# Patient Record
Sex: Female | Born: 1975 | Race: White | Hispanic: No | Smoking: Former smoker
Health system: Southern US, Community
[De-identification: ages and names within clinical notes are randomized; demographics above are authoritative.]

## PROBLEM LIST (undated history)

## (undated) DIAGNOSIS — R609 Edema, unspecified: Secondary | ICD-10-CM

## (undated) DIAGNOSIS — J189 Pneumonia, unspecified organism: Secondary | ICD-10-CM

## (undated) DIAGNOSIS — G4733 Obstructive sleep apnea (adult) (pediatric): Secondary | ICD-10-CM

## (undated) DIAGNOSIS — Z818 Family history of other mental and behavioral disorders: Secondary | ICD-10-CM

## (undated) DIAGNOSIS — J449 Chronic obstructive pulmonary disease, unspecified: Secondary | ICD-10-CM

## (undated) DIAGNOSIS — J45909 Unspecified asthma, uncomplicated: Secondary | ICD-10-CM

## (undated) DIAGNOSIS — Z8249 Family history of ischemic heart disease and other diseases of the circulatory system: Secondary | ICD-10-CM

## (undated) DIAGNOSIS — K219 Gastro-esophageal reflux disease without esophagitis: Secondary | ICD-10-CM

## (undated) DIAGNOSIS — IMO0002 Reserved for concepts with insufficient information to code with codable children: Secondary | ICD-10-CM

## (undated) DIAGNOSIS — Q268 Other congenital malformations of great veins: Secondary | ICD-10-CM

## (undated) DIAGNOSIS — G4730 Sleep apnea, unspecified: Secondary | ICD-10-CM

## (undated) DIAGNOSIS — E669 Obesity, unspecified: Secondary | ICD-10-CM

## (undated) DIAGNOSIS — T7840XA Allergy, unspecified, initial encounter: Secondary | ICD-10-CM

## (undated) DIAGNOSIS — F17200 Nicotine dependence, unspecified, uncomplicated: Secondary | ICD-10-CM

## (undated) HISTORY — DX: Other congenital malformations of great veins: Q26.8

## (undated) HISTORY — DX: Edema, unspecified: R60.9

## (undated) HISTORY — DX: Chronic obstructive pulmonary disease, unspecified: J44.9

## (undated) HISTORY — DX: Gastro-esophageal reflux disease without esophagitis: K21.9

## (undated) HISTORY — DX: Pneumonia, unspecified organism: J18.9

## (undated) HISTORY — PX: EYE SURGERY: SHX253

## (undated) HISTORY — DX: Family history of ischemic heart disease and other diseases of the circulatory system: Z82.49

## (undated) HISTORY — PX: ABDOMINAL HYSTERECTOMY: SHX81

## (undated) HISTORY — DX: Obstructive sleep apnea (adult) (pediatric): G47.33

## (undated) HISTORY — DX: Family history of other mental and behavioral disorders: Z81.8

## (undated) HISTORY — DX: Nicotine dependence, unspecified, uncomplicated: F17.200

## (undated) HISTORY — DX: Sleep apnea, unspecified: G47.30

## (undated) HISTORY — DX: Obesity, unspecified: E66.9

## (undated) HISTORY — DX: Allergy, unspecified, initial encounter: T78.40XA

---

## 1998-08-25 ENCOUNTER — Emergency Department (HOSPITAL_COMMUNITY): Admission: EM | Admit: 1998-08-25 | Discharge: 1998-08-25 | Payer: Self-pay | Admitting: Emergency Medicine

## 2001-04-29 ENCOUNTER — Emergency Department (HOSPITAL_COMMUNITY): Admission: EM | Admit: 2001-04-29 | Discharge: 2001-04-29 | Payer: Self-pay | Admitting: Emergency Medicine

## 2001-05-01 ENCOUNTER — Inpatient Hospital Stay (HOSPITAL_COMMUNITY): Admission: EM | Admit: 2001-05-01 | Discharge: 2001-05-04 | Payer: Self-pay

## 2001-05-03 ENCOUNTER — Encounter: Payer: Self-pay | Admitting: Internal Medicine

## 2001-05-07 ENCOUNTER — Encounter: Admission: RE | Admit: 2001-05-07 | Discharge: 2001-05-07 | Payer: Self-pay

## 2002-07-14 ENCOUNTER — Emergency Department (HOSPITAL_COMMUNITY): Admission: EM | Admit: 2002-07-14 | Discharge: 2002-07-15 | Payer: Self-pay | Admitting: Emergency Medicine

## 2002-09-18 ENCOUNTER — Emergency Department (HOSPITAL_COMMUNITY): Admission: EM | Admit: 2002-09-18 | Discharge: 2002-09-18 | Payer: Self-pay | Admitting: Emergency Medicine

## 2002-09-18 ENCOUNTER — Encounter: Payer: Self-pay | Admitting: Emergency Medicine

## 2005-06-18 ENCOUNTER — Ambulatory Visit: Payer: Self-pay | Admitting: Internal Medicine

## 2005-06-19 ENCOUNTER — Ambulatory Visit (HOSPITAL_COMMUNITY): Admission: RE | Admit: 2005-06-19 | Discharge: 2005-06-19 | Payer: Self-pay | Admitting: Internal Medicine

## 2005-06-20 ENCOUNTER — Ambulatory Visit: Payer: Self-pay | Admitting: *Deleted

## 2005-06-27 ENCOUNTER — Ambulatory Visit: Payer: Self-pay | Admitting: Internal Medicine

## 2005-07-01 ENCOUNTER — Ambulatory Visit: Payer: Self-pay | Admitting: Internal Medicine

## 2005-07-04 ENCOUNTER — Ambulatory Visit: Payer: Self-pay | Admitting: Internal Medicine

## 2005-07-17 ENCOUNTER — Ambulatory Visit: Payer: Self-pay

## 2005-07-17 ENCOUNTER — Encounter: Payer: Self-pay | Admitting: Cardiology

## 2006-01-10 ENCOUNTER — Emergency Department (HOSPITAL_COMMUNITY): Admission: EM | Admit: 2006-01-10 | Discharge: 2006-01-10 | Payer: Self-pay | Admitting: Family Medicine

## 2006-07-07 ENCOUNTER — Other Ambulatory Visit: Admission: RE | Admit: 2006-07-07 | Discharge: 2006-07-07 | Payer: Self-pay | Admitting: Obstetrics and Gynecology

## 2006-07-17 ENCOUNTER — Ambulatory Visit (HOSPITAL_COMMUNITY): Admission: RE | Admit: 2006-07-17 | Discharge: 2006-07-17 | Payer: Self-pay | Admitting: Obstetrics and Gynecology

## 2006-08-14 ENCOUNTER — Encounter: Admission: RE | Admit: 2006-08-14 | Discharge: 2006-08-14 | Payer: Self-pay | Admitting: Obstetrics and Gynecology

## 2006-11-12 ENCOUNTER — Emergency Department (HOSPITAL_COMMUNITY): Admission: EM | Admit: 2006-11-12 | Discharge: 2006-11-12 | Payer: Self-pay | Admitting: Emergency Medicine

## 2006-11-26 LAB — CONVERTED CEMR LAB: Pap Smear: NORMAL

## 2007-03-05 ENCOUNTER — Ambulatory Visit: Payer: Self-pay | Admitting: Pulmonary Disease

## 2007-03-09 ENCOUNTER — Emergency Department (HOSPITAL_COMMUNITY): Admission: EM | Admit: 2007-03-09 | Discharge: 2007-03-10 | Payer: Self-pay | Admitting: *Deleted

## 2007-03-31 DIAGNOSIS — J449 Chronic obstructive pulmonary disease, unspecified: Secondary | ICD-10-CM | POA: Insufficient documentation

## 2007-03-31 DIAGNOSIS — F17200 Nicotine dependence, unspecified, uncomplicated: Secondary | ICD-10-CM | POA: Insufficient documentation

## 2007-03-31 DIAGNOSIS — E669 Obesity, unspecified: Secondary | ICD-10-CM | POA: Insufficient documentation

## 2007-03-31 DIAGNOSIS — J4489 Other specified chronic obstructive pulmonary disease: Secondary | ICD-10-CM | POA: Insufficient documentation

## 2007-03-31 DIAGNOSIS — Q268 Other congenital malformations of great veins: Secondary | ICD-10-CM | POA: Insufficient documentation

## 2007-04-23 ENCOUNTER — Ambulatory Visit: Payer: Self-pay | Admitting: Internal Medicine

## 2007-04-30 ENCOUNTER — Telehealth: Payer: Self-pay | Admitting: Internal Medicine

## 2007-05-18 ENCOUNTER — Telehealth (INDEPENDENT_AMBULATORY_CARE_PROVIDER_SITE_OTHER): Payer: Self-pay | Admitting: *Deleted

## 2007-05-20 ENCOUNTER — Ambulatory Visit: Payer: Self-pay | Admitting: Internal Medicine

## 2007-05-27 ENCOUNTER — Telehealth: Payer: Self-pay | Admitting: Internal Medicine

## 2007-07-01 ENCOUNTER — Encounter: Payer: Self-pay | Admitting: Internal Medicine

## 2007-07-03 ENCOUNTER — Ambulatory Visit (HOSPITAL_COMMUNITY): Admission: RE | Admit: 2007-07-03 | Discharge: 2007-07-03 | Payer: Self-pay | Admitting: Surgery

## 2007-07-03 ENCOUNTER — Telehealth (INDEPENDENT_AMBULATORY_CARE_PROVIDER_SITE_OTHER): Payer: Self-pay | Admitting: *Deleted

## 2007-07-09 ENCOUNTER — Ambulatory Visit (HOSPITAL_COMMUNITY): Admission: RE | Admit: 2007-07-09 | Discharge: 2007-07-09 | Payer: Self-pay | Admitting: Surgery

## 2007-07-10 ENCOUNTER — Ambulatory Visit: Payer: Self-pay | Admitting: Internal Medicine

## 2007-07-10 ENCOUNTER — Telehealth: Payer: Self-pay | Admitting: Internal Medicine

## 2007-07-12 DIAGNOSIS — K219 Gastro-esophageal reflux disease without esophagitis: Secondary | ICD-10-CM | POA: Insufficient documentation

## 2007-07-14 ENCOUNTER — Telehealth (INDEPENDENT_AMBULATORY_CARE_PROVIDER_SITE_OTHER): Payer: Self-pay | Admitting: *Deleted

## 2007-07-28 ENCOUNTER — Encounter: Admission: RE | Admit: 2007-07-28 | Discharge: 2007-07-28 | Payer: Self-pay | Admitting: Surgery

## 2007-09-02 ENCOUNTER — Telehealth: Payer: Self-pay | Admitting: Internal Medicine

## 2007-09-07 ENCOUNTER — Telehealth (INDEPENDENT_AMBULATORY_CARE_PROVIDER_SITE_OTHER): Payer: Self-pay | Admitting: *Deleted

## 2007-09-07 ENCOUNTER — Emergency Department (HOSPITAL_COMMUNITY): Admission: EM | Admit: 2007-09-07 | Discharge: 2007-09-07 | Payer: Self-pay | Admitting: Emergency Medicine

## 2007-09-08 ENCOUNTER — Ambulatory Visit: Payer: Self-pay | Admitting: Internal Medicine

## 2007-09-08 DIAGNOSIS — R609 Edema, unspecified: Secondary | ICD-10-CM | POA: Insufficient documentation

## 2007-09-09 ENCOUNTER — Encounter: Payer: Self-pay | Admitting: Internal Medicine

## 2007-09-09 ENCOUNTER — Ambulatory Visit (HOSPITAL_BASED_OUTPATIENT_CLINIC_OR_DEPARTMENT_OTHER): Admission: RE | Admit: 2007-09-09 | Discharge: 2007-09-09 | Payer: Self-pay | Admitting: *Deleted

## 2007-09-12 ENCOUNTER — Ambulatory Visit: Payer: Self-pay | Admitting: Internal Medicine

## 2007-09-23 ENCOUNTER — Ambulatory Visit: Payer: Self-pay | Admitting: Internal Medicine

## 2007-09-23 DIAGNOSIS — J309 Allergic rhinitis, unspecified: Secondary | ICD-10-CM | POA: Insufficient documentation

## 2007-09-23 LAB — CONVERTED CEMR LAB
BUN: 15 mg/dL (ref 6–23)
Basophils Absolute: 0.1 10*3/uL (ref 0.0–0.1)
CO2: 32 meq/L (ref 19–32)
Calcium: 9.1 mg/dL (ref 8.4–10.5)
Chloride: 108 meq/L (ref 96–112)
Eosinophils Absolute: 0.1 10*3/uL (ref 0.0–0.7)
GFR calc Af Amer: 83 mL/min
HCT: 39.2 % (ref 36.0–46.0)
Hemoglobin: 12.9 g/dL (ref 12.0–15.0)
Lymphocytes Relative: 22 % (ref 12.0–46.0)
MCHC: 32.8 g/dL (ref 30.0–36.0)
Monocytes Relative: 5.1 % (ref 3.0–12.0)
Neutro Abs: 7.9 10*3/uL — ABNORMAL HIGH (ref 1.4–7.7)
Platelets: 226 10*3/uL (ref 150–400)
Potassium: 3.8 meq/L (ref 3.5–5.1)

## 2007-10-12 HISTORY — PX: GASTRIC BYPASS: SHX52

## 2007-10-15 ENCOUNTER — Ambulatory Visit: Payer: Self-pay | Admitting: Internal Medicine

## 2007-10-23 ENCOUNTER — Telehealth (INDEPENDENT_AMBULATORY_CARE_PROVIDER_SITE_OTHER): Payer: Self-pay | Admitting: *Deleted

## 2007-10-26 ENCOUNTER — Ambulatory Visit: Payer: Self-pay | Admitting: Internal Medicine

## 2007-11-18 ENCOUNTER — Telehealth: Payer: Self-pay | Admitting: Internal Medicine

## 2007-11-24 ENCOUNTER — Encounter: Admission: RE | Admit: 2007-11-24 | Discharge: 2008-02-02 | Payer: Self-pay | Admitting: Surgery

## 2007-11-25 ENCOUNTER — Telehealth: Payer: Self-pay | Admitting: Internal Medicine

## 2007-11-25 ENCOUNTER — Encounter: Payer: Self-pay | Admitting: Internal Medicine

## 2007-12-08 ENCOUNTER — Inpatient Hospital Stay (HOSPITAL_COMMUNITY): Admission: RE | Admit: 2007-12-08 | Discharge: 2007-12-10 | Payer: Self-pay | Admitting: Surgery

## 2007-12-09 ENCOUNTER — Encounter (INDEPENDENT_AMBULATORY_CARE_PROVIDER_SITE_OTHER): Payer: Self-pay | Admitting: Surgery

## 2007-12-09 ENCOUNTER — Ambulatory Visit: Payer: Self-pay | Admitting: Vascular Surgery

## 2008-01-14 ENCOUNTER — Encounter: Payer: Self-pay | Admitting: Internal Medicine

## 2008-01-14 ENCOUNTER — Telehealth (INDEPENDENT_AMBULATORY_CARE_PROVIDER_SITE_OTHER): Payer: Self-pay | Admitting: *Deleted

## 2008-01-28 ENCOUNTER — Encounter: Payer: Self-pay | Admitting: Internal Medicine

## 2008-02-09 ENCOUNTER — Encounter: Admission: RE | Admit: 2008-02-09 | Discharge: 2008-03-22 | Payer: Self-pay | Admitting: Surgery

## 2008-02-12 ENCOUNTER — Encounter
Admission: RE | Admit: 2008-02-12 | Discharge: 2008-02-12 | Payer: Self-pay | Admitting: Physical Medicine and Rehabilitation

## 2008-02-15 ENCOUNTER — Ambulatory Visit: Payer: Self-pay | Admitting: Internal Medicine

## 2008-05-12 ENCOUNTER — Encounter (INDEPENDENT_AMBULATORY_CARE_PROVIDER_SITE_OTHER): Payer: Self-pay | Admitting: *Deleted

## 2008-05-19 ENCOUNTER — Ambulatory Visit: Payer: Self-pay | Admitting: Family Medicine

## 2008-05-19 DIAGNOSIS — H409 Unspecified glaucoma: Secondary | ICD-10-CM | POA: Insufficient documentation

## 2008-05-20 ENCOUNTER — Ambulatory Visit: Payer: Self-pay | Admitting: Family Medicine

## 2008-05-20 LAB — CONVERTED CEMR LAB
Bilirubin Urine: NEGATIVE
Blood in Urine, dipstick: NEGATIVE
Ketones, urine, test strip: NEGATIVE
Specific Gravity, Urine: <1.005
pH: 6.5

## 2008-06-02 LAB — CONVERTED CEMR LAB
AST: 25 units/L (ref 0–37)
Albumin: 3.6 g/dL (ref 3.5–5.2)
Alkaline Phosphatase: 55 units/L (ref 39–117)
Basophils Absolute: 0 10*3/uL (ref 0.0–0.1)
Basophils Relative: 0.2 % (ref 0.0–3.0)
Bilirubin, Direct: 0.2 mg/dL (ref 0.0–0.3)
Calcium: 9.6 mg/dL (ref 8.4–10.5)
Chloride: 106 meq/L (ref 96–112)
Creatinine, Ser: 0.8 mg/dL (ref 0.4–1.2)
GFR calc Af Amer: 106 mL/min
GFR calc non Af Amer: 88 mL/min
Glucose, Bld: 90 mg/dL (ref 70–99)
HDL: 34.2 mg/dL — ABNORMAL LOW (ref >39.0)
Lymphocytes Relative: 34.5 % (ref 12.0–46.0)
MCHC: 34.5 g/dL (ref 30.0–36.0)
MCV: 94.4 fL (ref 78.0–100.0)
Monocytes Absolute: 0.5 10*3/uL (ref 0.1–1.0)
Neutrophils Relative %: 56 % (ref 43.0–77.0)
RDW: 12.1 % (ref 11.5–14.6)
TSH: 1.94 microintl units/mL (ref 0.35–5.50)
Total Bilirubin: 0.8 mg/dL (ref 0.3–1.2)
Total CHOL/HDL Ratio: 5.5
Total Protein: 6.6 g/dL (ref 6.0–8.3)
Triglycerides: 84 mg/dL (ref 0–149)
WBC: 6.8 10*3/uL (ref 4.5–10.5)

## 2008-06-03 ENCOUNTER — Encounter (INDEPENDENT_AMBULATORY_CARE_PROVIDER_SITE_OTHER): Payer: Self-pay | Admitting: *Deleted

## 2008-07-22 ENCOUNTER — Ambulatory Visit: Payer: Self-pay | Admitting: Internal Medicine

## 2008-08-16 ENCOUNTER — Telehealth: Payer: Self-pay | Admitting: Internal Medicine

## 2008-08-23 ENCOUNTER — Ambulatory Visit: Payer: Self-pay | Admitting: Family Medicine

## 2008-08-23 ENCOUNTER — Telehealth (INDEPENDENT_AMBULATORY_CARE_PROVIDER_SITE_OTHER): Payer: Self-pay | Admitting: *Deleted

## 2008-10-12 ENCOUNTER — Encounter: Payer: Self-pay | Admitting: Family Medicine

## 2009-01-06 ENCOUNTER — Telehealth (INDEPENDENT_AMBULATORY_CARE_PROVIDER_SITE_OTHER): Payer: Self-pay | Admitting: *Deleted

## 2009-01-06 ENCOUNTER — Telehealth: Payer: Self-pay | Admitting: Internal Medicine

## 2009-01-19 ENCOUNTER — Ambulatory Visit: Payer: Self-pay | Admitting: Internal Medicine

## 2009-04-05 ENCOUNTER — Ambulatory Visit: Payer: Self-pay | Admitting: Internal Medicine

## 2009-04-13 ENCOUNTER — Encounter: Payer: Self-pay | Admitting: Internal Medicine

## 2009-04-14 ENCOUNTER — Telehealth: Payer: Self-pay | Admitting: Internal Medicine

## 2009-04-21 ENCOUNTER — Telehealth (INDEPENDENT_AMBULATORY_CARE_PROVIDER_SITE_OTHER): Payer: Self-pay | Admitting: *Deleted

## 2009-04-27 ENCOUNTER — Encounter: Payer: Self-pay | Admitting: Internal Medicine

## 2009-05-03 ENCOUNTER — Ambulatory Visit: Payer: Self-pay | Admitting: Internal Medicine

## 2009-05-19 ENCOUNTER — Telehealth: Payer: Self-pay | Admitting: Family Medicine

## 2009-06-12 ENCOUNTER — Telehealth (INDEPENDENT_AMBULATORY_CARE_PROVIDER_SITE_OTHER): Payer: Self-pay | Admitting: *Deleted

## 2009-06-16 ENCOUNTER — Encounter: Payer: Self-pay | Admitting: Family Medicine

## 2009-07-14 ENCOUNTER — Telehealth: Payer: Self-pay | Admitting: Internal Medicine

## 2009-07-18 ENCOUNTER — Ambulatory Visit: Payer: Self-pay | Admitting: Family Medicine

## 2009-07-18 DIAGNOSIS — Z9884 Bariatric surgery status: Secondary | ICD-10-CM | POA: Insufficient documentation

## 2009-07-18 DIAGNOSIS — G4733 Obstructive sleep apnea (adult) (pediatric): Secondary | ICD-10-CM | POA: Insufficient documentation

## 2009-07-19 ENCOUNTER — Encounter: Payer: Self-pay | Admitting: Internal Medicine

## 2009-10-06 ENCOUNTER — Ambulatory Visit: Payer: Self-pay | Admitting: Family Medicine

## 2009-10-06 DIAGNOSIS — B880 Other acariasis: Secondary | ICD-10-CM | POA: Insufficient documentation

## 2010-05-01 ENCOUNTER — Encounter: Payer: Self-pay | Admitting: Internal Medicine

## 2010-05-01 ENCOUNTER — Ambulatory Visit: Payer: Self-pay | Admitting: Internal Medicine

## 2010-05-03 ENCOUNTER — Ambulatory Visit: Payer: Self-pay | Admitting: Internal Medicine

## 2010-06-03 ENCOUNTER — Encounter: Payer: Self-pay | Admitting: Obstetrics and Gynecology

## 2010-06-12 NOTE — Progress Notes (Signed)
Summary: Record request  Request for records received from [facility] Request forwarded to [facility]. Anna [name]  June 12, 2009 4:38 PM

## 2010-06-12 NOTE — Assessment & Plan Note (Signed)
Summary: coughing up green sputum,horseness//lch   Vital Signs:  Patient profile:   35 year old female Weight:      247 pounds Temp:     98.2 degrees F oral Pulse rate:   72 / minute Pulse rhythm:   regular BP sitting:   120 / 84  (left arm) Cuff size:   large  Vitals Entered By: Army Fossa CMA (Oct 06, 2009 10:44 AM) CC: Pt here c/o, achey, chest congestion, sinus pressure. pt c/o rash that itches all over., URI symptoms   History of Present Illness:       This is a 35 year old woman who presents with URI symptoms.  The symptoms began 2 days ago.  Pt taking mucinex with little relief.  The patient complains of nasal congestion, purulent nasal discharge, sore throat, productive cough, and sick contacts, but denies earache.  Associated symptoms include fever.  The patient denies stiff neck, dyspnea, wheezing, rash, vomiting, diarrhea, use of an antipyretic, and response to antipyretic.  The patient denies itchy watery eyes, itchy throat, sneezing, seasonal symptoms, response to antihistamine, headache, muscle aches, and severe fatigue.  The patient denies the following risk factors for Strep sinusitis: unilateral facial pain, unilateral nasal discharge, poor response to decongestant, double sickening, tooth pain, Strep exposure, tender adenopathy, and absence of cough.     Current Medications (verified): 1)  Qvar 80 Mcg/act  Aers (Beclomethasone Dipropionate) .... Inhale 2 Puffs Two Times A Day 2)  Advair Diskus 250-50 Mcg/dose  Misc (Fluticasone-Salmeterol) .Marland Kitchen.. 1 Puff and Rinse, Twice Daily 3)  Singulair 10 Mg  Tabs (Montelukast Sodium) .... Take 1 Tablet By Mouth Once A Day 4)  Proair Hfa 108 (90 Base) Mcg/act Aers (Albuterol Sulfate) .... 2 Puffs Four Times A Day As Needed Rescue 5)  Albuterol Sulfate (2.5 Mg/31ml) 0.083%  Nebu (Albuterol Sulfate) .... Use As Directed As Needed 6)  Alphagan P 0.1 % Soln (Brimonidine Tartrate) .... Use As Directed 7)  Azopt 1 % Susp (Brinzolamide)  .... Use As Directed 8)  Travatan Z 0.004 % Soln (Travoprost) .... Place 1 Drop in Each Eye At Night 9)  Vitamin B-12 1000 Mcg Subl (Cyanocobalamin) .... Take 1 Tab Once Daily 10)  Flintstones Plus Iron  Chew (Pediatric Multivitamins-Iron) .... Take 1 Tab Two Times A Day 11)  Biotin 1000 Mcg Tabs (Biotin) .... Take 1 Tab Once Daily 12)  Cvs Calcium Chews 500-200-40 Mg-Unt-Mcg Chew (Calcium-Vitamin D-Vitamin K) .... Take 2 Chewables Once Daily 13)  Claritin 10 Mg Tabs (Loratadine) .... Per Bottle 14)  Tramadol Hcl 50 Mg Tabs (Tramadol Hcl) .Marland Kitchen.. 1 By Mouth Every 6 Hrs. 15)  Flexeril 10 Mg Tabs (Cyclobenzaprine Hcl) .... As Needed 16)  Augmentin 875-125 Mg Tabs (Amoxicillin-Pot Clavulanate) .Marland Kitchen.. 1 By Mouth Two Times A Day 17)  Cheratussin Ac 100-10 Mg/43ml Syrp (Guaifenesin-Codeine) .Marland Kitchen.. 1-2 Tsp By Mouth At Bedtime As Needed Cough  Allergies (verified): No Known Drug Allergies  Past History:  Past medical, surgical, family and social histories (including risk factors) reviewed for relevance to current acute and chronic problems.  Past Medical History: Reviewed history from 07/18/2009 and no changes required. 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) TOBACCO ABUSE (ICD-305.1) COPD (ICD-496) PFT 07/22/08- FEV1/FVC- 0.49 Sleep Apnea-NPSG 09/09/07 AHI 8.1/hr, prolonged desat Pneumonia hosp 2002 GLAUCOMA (ICD-365.9) FAMILY HISTORY DEPRESSION (ICD-V17.0) FAMILY HISTORY OF CAD FEMALE 1ST DEGREE RELATIVE <50 (ICD-V17.3) FAMILY HISTORY OF CAD FEMALE 1ST DEGREE RELATIVE <60 (ICD-V16.49) ALLERGIC  RHINITIS (ICD-477.9) SLEEP APNEA, OBSTRUCTIVE (ICD-327.23) EDEMA (ICD-782.3) G E R D (ICD-530.81) 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) TOBACCO ABUSE (ICD-305.1) COPD (ICD-496) COPD Current Problems:  BARIATRIC SURGERY STATUS (ICD-V45.86) BRONCHITIS- ACUTE (ICD-466.0) OBSTRUCTIVE SLEEP APNEA  (ICD-327.23) PREVENTIVE HEALTH CARE (ICD-V70.0) GLAUCOMA (ICD-365.9) FAMILY HISTORY DEPRESSION (ICD-V17.0) FAMILY HISTORY OF CAD FEMALE 1ST DEGREE RELATIVE <50 (ICD-V17.3) FAMILY HISTORY OF CAD FEMALE 1ST DEGREE RELATIVE <60 (ICD-V16.49) ALLERGIC RHINITIS (ICD-477.9) EDEMA (ICD-782.3) G E R D (ICD-530.81) 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) TOBACCO ABUSE (ICD-305.1) COPD (ICD-496)  Past Surgical History: Reviewed history from 07/18/2009 and no changes required. Partial hysterectomy -oophorectomy 1990 Gastric bypass 6/09  Family History: Reviewed history from 05/19/2008 and no changes required. COPD Asthma heart disease Family History of CAD Female 1st degree relative <60 Family History of CAD Female 1st degree relative <50 MGF--lung cancr Family History Lung cancer Family History Uterine cancer Family History Depression Family History High cholesterol Family History Hypertension PGM--DMII  Social History: Reviewed history from 05/19/2008 and no changes required. 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  Otoscopic examination reveals clear canals, tympanic membranes are intact bilaterally without bulging, retraction, inflammation or discharge. Hearing is grossly normal bilaterally. Nose:  External nasal examination shows no deformity or inflammation. Nasal mucosa are pink and moist without lesions or exudates. Mouth:  Oral mucosa and oropharynx without lesions or exudates.  Teeth in good repair. Neck:  No deformities, masses, or tenderness noted. Lungs:  R wheezes and L wheezes.   Heart:  normal rate and no  murmur.   Skin:  + papular rash on arms--partner dx with chiggers Psych:  Cognition and judgment appear intact. Alert and cooperative with normal attention span and concentration. No apparent delusions, illusions, hallucinations   Impression & Recommendations:  Problem # 1:  BRONCHITIS- ACUTE (ICD-466.0)  Her updated medication list for this problem includes:    Qvar 80 Mcg/act Aers (Beclomethasone dipropionate) ..... Inhale 2 puffs two times a day    Advair Diskus 250-50 Mcg/dose Misc (Fluticasone-salmeterol) .Marland Kitchen... 1 puff and rinse, twice daily    Singulair 10 Mg Tabs (Montelukast sodium) .Marland Kitchen... Take 1 tablet by mouth once a day    Proair Hfa 108 (90 Base) Mcg/act Aers (Albuterol sulfate) .Marland Kitchen... 2 puffs four times a day as needed rescue    Albuterol Sulfate (2.5 Mg/28ml) 0.083% Nebu (Albuterol sulfate) ..... Use as directed as needed    Augmentin 875-125 Mg Tabs (Amoxicillin-pot clavulanate) .Marland Kitchen... 1 by mouth two times a day    Cheratussin Ac 100-10 Mg/73ml Syrp (Guaifenesin-codeine) .Marland Kitchen... 1-2 tsp by mouth at bedtime as needed cough  Take antibiotics and other medications as directed. Encouraged to push clear liquids, get enough rest, and take acetaminophen as needed. To be seen in 5-7 days if no improvement, sooner if worse.------ increase qvar to 2 puffs two times a day while sick  Problem # 2:  CHIGGERS (ICD-133.8)  take claritin daily for itching can use benadryl at night  Orders: Admin of Therapeutic Inj  intramuscular or subcutaneous (16109) Depo- Medrol 80mg  (J1040)  Complete Medication List: 1)  Qvar 80 Mcg/act Aers (Beclomethasone dipropionate) .... Inhale 2 puffs two times a day 2)  Advair Diskus 250-50 Mcg/dose Misc (Fluticasone-salmeterol) .Marland KitchenMarland KitchenMarland Kitchen  1 puff and rinse, twice daily 3)  Singulair 10 Mg Tabs (Montelukast sodium) .... Take 1 tablet by mouth once a day 4)  Proair Hfa 108 (90 Base) Mcg/act Aers (Albuterol sulfate) .... 2 puffs four times a day as needed rescue 5)   Albuterol Sulfate (2.5 Mg/6ml) 0.083% Nebu (Albuterol sulfate) .... Use as directed as needed 6)  Alphagan P 0.1 % Soln (Brimonidine tartrate) .... Use as directed 7)  Azopt 1 % Susp (Brinzolamide) .... Use as directed 8)  Travatan Z 0.004 % Soln (Travoprost) .... Place 1 drop in each eye at night 9)  Vitamin B-12 1000 Mcg Subl (Cyanocobalamin) .... Take 1 tab once daily 10)  Flintstones Plus Iron Chew (Pediatric multivitamins-iron) .... Take 1 tab two times a day 11)  Biotin 1000 Mcg Tabs (Biotin) .... Take 1 tab once daily 12)  [facility]vs Calcium Chews 500-200-40 Mg-unt-mcg Chew (Calcium-vitamin d-vitamin k) .... Take 2 chewables once daily 13)  Claritin 10 Mg Tabs (Loratadine) .... Per bottle 14)  Tramadol Hcl 50 Mg Tabs (Tramadol hcl) .Marland Kitchen.. 1 by mouth every 6 hrs. 15)  Flexeril 10 Mg Tabs (Cyclobenzaprine hcl) .... As needed 16)  Augmentin 875-125 Mg Tabs (Amoxicillin-pot clavulanate) .Marland Kitchen.. 1 by mouth two times a day 17)  Cheratussin Ac 100-10 Mg/9ml Syrp (Guaifenesin-codeine) .Marland Kitchen.. 1-2 tsp by mouth at bedtime as needed cough Prescriptions: CHERATUSSIN AC 100-10 MG/5ML SYRP (GUAIFENESIN-CODEINE) 1-2 tsp by mouth at bedtime as needed cough  #6 oz x 0   Entered and Authorized by:   Loreen Freud DO   Signed by:   Loreen Freud DO on 10/06/2009   Method used:   Print then Give to Patient   RxID:   [id number] AUGMENTIN 875-125 MG TABS (AMOXICILLIN-POT CLAVULANATE) 1 by mouth two times a day  #20 x 0   Entered and Authorized by:   Loreen Freud DO   Signed by:   Loreen Freud DO on 10/06/2009   Method used:   Electronically to        [facility] [phone]* (retail)       [address]       [place], [place]  [address]       Ph: [phone] or [phone]       Fax: [phone]   RxID:   [id number]    Medication Administration  Injection # 1:    Medication: Depo- Medrol 80mg     Diagnosis: CHIGGERS (ICD-133.8)    Route: IM    Site: RUOQ gluteus    Exp  Date: 04/2010    Lot #: [id number]    Mfr: [email or web address]    Patient tolerated injection without complications    Given by: Army Fossa CMA (Oct 06, 2009 11:26 AM)  Orders Added: 1)  Est. Patient Level III [80998] 2)  Admin of Therapeutic Inj  intramuscular or subcutaneous [96372] 3)  Depo- Medrol 80mg  [J1040]

## 2010-06-12 NOTE — Assessment & Plan Note (Signed)
Summary: Anna [name]   Vital Signs:  Patient profile:   35 year old female Weight:      239 pounds Temp:     98.4 degrees F oral Pulse rate:   68 / minute Pulse rhythm:   regular BP sitting:   122 / 84  (left arm) Cuff size:   large  Vitals Entered By: Army Fossa CMA (July 18, 2009 8:40 AM) CC: CPX, no pap.  Comments Med list reviewed.   History of Present Illness: Pt here for cpe no pap.  Labs reviewed from [facility].   No complaints.   Pt sees gyn for pap.    Preventive Screening-Counseling & Management  Alcohol-Tobacco     Alcohol drinks/day: 1     Alcohol type: wine     Smoking Status: current     Smoking Cessation Counseling: yes     Smoke Cessation Stage: ready     Packs/Day: 0.5     Year Started: 1990     Pack years: 16 years, 2ppd     Passive Smoke Exposure: no  Caffeine-Diet-Exercise     Caffeine use/day: 1     Diet Comments: gastric bypass     Does Patient Exercise: yes     Type of exercise: walking     Exercise (avg: min/session): 30-60     Times/week: 4  Hep-HIV-STD-Contraception     HIV Risk: no     Dental Visit-last 6 months no     Dental Care Counseling: to seek dental care; no dental care within six months     SBE monthly: yes     Sun Exposure-Excessive: occasionally  Safety-Violence-Falls     Seat Belt Use: 100      Sexual History:  same sex encounters.        Drug Use:  never.    Current Medications (verified): 1)  Qvar 80 Mcg/act  Aers (Beclomethasone Dipropionate) .... Inhale 2 Puffs Two Times A Day 2)  Advair Diskus 250-50 Mcg/dose  Misc (Fluticasone-Salmeterol) .Marland Kitchen.. 1 Puff and Rinse, Twice Daily 3)  Singulair 10 Mg  Tabs (Montelukast Sodium) .... Take 1 Tablet By Mouth Once A Day 4)  Proair Hfa 108 (90 Base) Mcg/act Aers (Albuterol Sulfate) .... 2 Puffs Four Times A Day As Needed Rescue 5)  Albuterol Sulfate (2.5 Mg/23ml) 0.083%  Nebu (Albuterol Sulfate) .... Use As Directed As Needed 6)  Alphagan P 0.1 % Soln (Brimonidine Tartrate) ....  Use As Directed 7)  Azopt 1 % Susp (Brinzolamide) .... Use As Directed 8)  Travatan Z 0.004 % Soln (Travoprost) .... Place 1 Drop in Each Eye At Night 9)  Vitamin B-12 1000 Mcg Subl (Cyanocobalamin) .... Take 1 Tab Once Daily 10)  Flintstones Plus Iron  Chew (Pediatric Multivitamins-Iron) .... Take 1 Tab Two Times A Day 11)  Biotin 1000 Mcg Tabs (Biotin) .... Take 1 Tab Once Daily 12)  Cvs Calcium Chews 500-200-40 Mg-Unt-Mcg Chew (Calcium-Vitamin D-Vitamin K) .... Take 2 Chewables Once Daily 13)  Claritin 10 Mg Tabs (Loratadine) .... Per Bottle 14)  Tramadol Hcl 50 Mg Tabs (Tramadol Hcl) .Marland Kitchen.. 1 By Mouth Every 6 Hrs. 15)  Flexeril 10 Mg Tabs (Cyclobenzaprine Hcl) .... 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) TOBACCO ABUSE (ICD-305.1) COPD (ICD-496) COPD Current Problems:  BARIATRIC SURGERY STATUS (ICD-V45.86) BRONCHITIS- ACUTE (ICD-466.0) OBSTRUCTIVE SLEEP APNEA (ICD-327.23) PREVENTIVE HEALTH CARE (ICD-V70.0) GLAUCOMA (ICD-365.9) FAMILY HISTORY DEPRESSION (ICD-V17.0) FAMILY HISTORY OF CAD FEMALE 1ST DEGREE RELATIVE <50 (ICD-V17.3) FAMILY HISTORY OF CAD FEMALE 1ST DEGREE RELATIVE <60 (ICD-V16.49) ALLERGIC RHINITIS (ICD-477.9) EDEMA (ICD-782.3) G E R D (ICD-530.81) 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 COPD Asthma heart disease Family History of CAD Female 1st degree relative <60 Family History of CAD Female 1st degree relative <50 MGF--lung cancr Family History Lung cancer Family History Uterine cancer Family History Depression Family History High cholesterol Family History Hypertension PGM--DMII  Social History: Reviewed history from 05/19/2008 and no changes required. 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 Eyes:  Denies blurring, discharge, double vision, eye irritation, eye pain, halos, itching, light sensitivity,  red eye, vision loss-1 eye, and vision loss-both eyes; optho q24m -- glaucoma. ENT:  Denies decreased hearing, difficulty swallowing, ear discharge, earache, hoarseness, nasal congestion, nosebleeds, postnasal drainage, ringing in ears, sinus pressure, and sore throat. CV:  Denies bluish discoloration of lips or nails, chest pain or discomfort, difficulty breathing at night, difficulty breathing while lying down, fainting, fatigue, leg cramps with exertion, lightheadness, near fainting, palpitations, shortness of breath with exertion, swelling of feet, swelling of hands, and weight gain. Resp:  Denies chest discomfort, chest pain with inspiration, cough, coughing up blood, excessive snoring, hypersomnolence, morning headaches, pleuritic, shortness of breath, sputum productive, and wheezing. GI:  Denies abdominal pain, bloody stools, change in bowel habits, constipation, dark tarry stools, diarrhea, excessive appetite, gas, hemorrhoids, indigestion, loss of appetite, nausea, vomiting, vomiting blood, and yellowish skin color. GU:  Denies abnormal vaginal bleeding, decreased libido, discharge, dysuria, genital sores, hematuria, incontinence, nocturia, urinary frequency, and urinary hesitancy. MS:  Denies joint pain, joint redness, joint swelling, loss of strength, low back pain, mid back pain, muscle aches, muscle , cramps, muscle weakness, stiffness, and thoracic pain; Dr Modesto Charon-- DJD--pain management. Derm:  Denies changes in color of skin, changes in nail beds, dryness, excessive perspiration, flushing, hair loss, insect bite(s), itching, lesion(s), poor wound healing, and rash. Neuro:  Denies brief paralysis, difficulty with concentration, disturbances in coordination, falling down, headaches, inability to speak, memory loss, numbness, poor balance, seizures, sensation of room spinning, tingling, tremors, visual disturbances, and weakness. Psych:  Denies alternate hallucination ( auditory/visual), anxiety,  depression, easily angered, easily tearful, irritability, mental problems, panic attacks, sense of great danger, suicidal thoughts/plans, thoughts of violence, unusual visions or sounds, and thoughts /plans of harming others. Endo:  Denies cold intolerance, excessive hunger, excessive thirst, excessive urination, heat intolerance, polyuria, and weight change. Heme:  Denies abnormal bruising, bleeding, enlarge lymph nodes, fevers, pallor, and skin discoloration. Allergy:  Denies hives or rash, itching eyes, persistent infections, seasonal allergies, and sneezing.   Impression & Recommendations:  Problem # 1:  PREVENTIVE HEALTH CARE (ICD-V70.0)  labs reviewed ghm UTD TO GO TO GYN FOR PAP  Orders: Tobacco use cessation intermediate 3-10 minutes (99406)  Problem # 2:  COPD (ICD-496) PER PULM Her updated medication list for this problem includes:    Qvar 80 Mcg/act Aers (Beclomethasone dipropionate) ..... Inhale 2 puffs two times a day    Advair Diskus 250-50 Mcg/dose Misc (Fluticasone-salmeterol) .Marland Kitchen... 1 puff and rinse, twice daily    Singulair 10 Mg Tabs (Montelukast sodium) .Marland Kitchen... Take 1 tablet by mouth once a day    Proair Hfa 108 (90 Base) Mcg/act Aers (Albuterol sulfate) .Marland Kitchen... 2 puffs four times a day as needed rescue    Albuterol Sulfate (2.5 Mg/90ml) 0.083% Nebu (Albuterol sulfate) ..... Use as directed as needed  Problem # 3:  TOBACCO ABUSE (ICD-305.1)  Encouraged smoking cessation and discussed different methods for smoking cessation.   Orders: Tobacco use cessation intermediate 3-10 minutes (99406)  Problem # 4:  G E R D (ICD-530.81)  Diagnostics Reviewed:  Discussed lifestyle modifications, diet, antacids/medications, and preventive measures. Handout provided.   Problem # 5:  BRONCHITIS- ACUTE (ICD-466.0)  Her updated medication list for this problem includes:    Qvar 80 Mcg/act Aers (Beclomethasone dipropionate) ..... Inhale 2 puffs two times a day    Advair Diskus  250-50 Mcg/dose Misc (Fluticasone-salmeterol) .Marland Kitchen... 1 puff and rinse, twice daily    Singulair 10 Mg Tabs (Montelukast sodium) .Marland Kitchen... Take 1 tablet by mouth once a day    Proair Hfa 108 (90 Base) Mcg/act Aers (Albuterol sulfate) .Marland Kitchen... 2 puffs four times a day as needed rescue    Albuterol Sulfate (2.5 Mg/36ml) 0.083% Nebu (Albuterol sulfate) ..... Use as directed as needed    Augmentin 875-125 Mg Tabs (Amoxicillin-pot clavulanate) .Marland Kitchen... 1 by mouth two times a day  Take antibiotics and other medications as directed. Encouraged to push clear liquids, get enough rest, and take acetaminophen as needed. To be seen in 5-7 days if no improvement, sooner if worse.  Complete Medication List: 1)  Qvar 80 Mcg/act Aers (Beclomethasone dipropionate) .... Inhale 2 puffs two times a day 2)  Advair Diskus 250-50 Mcg/dose Misc (Fluticasone-salmeterol) .Marland Kitchen.. 1 puff and rinse, twice daily 3)  Singulair 10 Mg Tabs (Montelukast sodium) .... Take 1 tablet by mouth once a day 4)  Proair Hfa 108 (90 Base) Mcg/act Aers (Albuterol sulfate) .... 2 puffs four times a day as needed rescue 5)  Albuterol Sulfate (2.5 Mg/20ml) 0.083% Nebu (Albuterol sulfate) .... Use as directed as needed 6)  Alphagan P 0.1 % Soln (Brimonidine tartrate) .... Use as directed 7)  Azopt 1 % Susp (Brinzolamide) .... Use as directed 8)  Travatan Z 0.004 % Soln (Travoprost) .... Place 1 drop in each eye at night 9)  Vitamin  B-12 1000 Mcg Subl (Cyanocobalamin) .... Take 1 tab once daily 10)  Flintstones Plus Iron Chew (Pediatric multivitamins-iron) .... Take 1 tab two times a day 11)  Biotin 1000 Mcg Tabs (Biotin) .... Take 1 tab once daily 12)  Cvs Calcium Chews 500-200-40 Mg-unt-mcg Chew (Calcium-vitamin d-vitamin k) .... Take 2 chewables once daily 13)  Claritin 10 Mg Tabs (Loratadine) .... Per bottle 14)  Tramadol Hcl 50 Mg Tabs (Tramadol hcl) .Marland Kitchen.. 1 by mouth every 6 hrs. 15)  Flexeril 10 Mg Tabs (Cyclobenzaprine hcl) .... As needed 16)  Augmentin  875-125 Mg Tabs (Amoxicillin-pot clavulanate) .Marland Kitchen.. 1 by mouth two times a day  Patient Instructions: 1)  rto 6 months for labs-- either here or with Dr Daphine Deutscher 2)  272.4  V45.86   cbcd, bmp, ibc, ferritin, lipid, hep, b12, vita d, hgba1c               Prescriptions: AUGMENTIN 875-125 MG TABS (AMOXICILLIN-POT CLAVULANATE) 1 by mouth two times a day  #20 x 0   Entered and Authorized by:   Loreen Freud DO   Signed by:   Loreen Freud DO on 07/18/2009   Method used:   Electronically to        [id number] (retail)       [address], [address]       [place], [place]  [address]       Ph: [phone]       Fax: [phone]   RxID:   [id number]   Appended Document: Anna [name]     Allergies: No Known Drug Allergies  Physical Exam  General:  Well-developed,well-nourished,in no acute distress; alert,appropriate and cooperative throughout examination Head:  Normocephalic and atraumatic without obvious abnormalities. No apparent alopecia or balding. Eyes:  r injected L eye normal  Ears:  External ear exam shows no significant lesions or deformities.  Otoscopic examination reveals clear canals, tympanic membranes are intact bilaterally without bulging, retraction, inflammation or discharge. Hearing is grossly normal bilaterally. Nose:  L frontal sinus tenderness, L maxillary sinus tenderness, R frontal sinus tenderness, and R maxillary sinus tenderness.   Mouth:  Oral mucosa and oropharynx without lesions or exudates.  Teeth in good repair. Neck:  No deformities, masses, or tenderness noted. Chest Wall:  No deformities, masses, or tenderness noted. Breasts:  gyn Lungs:  normal respiratory effort, no intercostal retractions, R wheezes, and L wheezes.   Heart:  normal rate and no murmur.   Abdomen:  Bowel sounds positive,abdomen soft and non-tender without masses, organomegaly or hernias noted. Genitalia:  gyn Msk:  normal ROM, no joint swelling, no  joint warmth, and no redness over joints.   Pulses:  R posterior tibial normal, R dorsalis pedis normal, L posterior tibial normal, and L dorsalis pedis normal.   Extremities:  No clubbing, cyanosis, edema, or deformity noted with normal full range of motion of all joints.   Neurologic:  No cranial nerve deficits noted. Station and gait are normal. Plantar reflexes are down-going bilaterally. DTRs are symmetrical throughout. Sensory, motor and coordinative functions appear intact. Skin:  Intact without suspicious lesions or rashes Cervical Nodes:  No lymphadenopathy noted Psych:  Oriented X3 and normally interactive.     Impression & Recommendations:  Problem # 1:  PREVENTIVE HEALTH CARE (ICD-V70.0)  Problem # 2:  BARIATRIC SURGERY STATUS (ICD-V45.86)  Problem # 3:  BRONCHITIS- ACUTE (ICD-466.0)  Her updated medication list for this problem  includes:    Qvar 80 Mcg/act Aers (Beclomethasone dipropionate) ..... Inhale 2 puffs two times a day    Advair Diskus 250-50 Mcg/dose Misc (Fluticasone-salmeterol) .Marland Kitchen... 1 puff and rinse, twice daily    Singulair 10 Mg Tabs (Montelukast sodium) .Marland Kitchen... Take 1 tablet by mouth once a day    Proair Hfa 108 (90 Base) Mcg/act Aers (Albuterol sulfate) .Marland Kitchen... 2 puffs four times a day as needed rescue    Albuterol Sulfate (2.5 Mg/50ml) 0.083% Nebu (Albuterol sulfate) ..... Use as directed as needed    Augmentin 875-125 Mg Tabs (Amoxicillin-pot clavulanate) .Marland Kitchen... 1 by mouth two times a day  Take antibiotics and other medications as directed. Encouraged to push clear liquids, get enough rest, and take acetaminophen as needed. To be seen in 5-7 days if no improvement, sooner if worse.  Problem # 4:  TOBACCO ABUSE (ICD-305.1)  Encouraged smoking cessation and discussed different methods for smoking cessation.   Problem # 5:  COPD (ICD-496)  Her updated medication list for this problem includes:    Qvar 80 Mcg/act Aers (Beclomethasone dipropionate) ..... Inhale 2  puffs two times a day    Advair Diskus 250-50 Mcg/dose Misc (Fluticasone-salmeterol) .Marland Kitchen... 1 puff and rinse, twice daily    Singulair 10 Mg Tabs (Montelukast sodium) .Marland Kitchen... Take 1 tablet by mouth once a day    Proair Hfa 108 (90 Base) Mcg/act Aers (Albuterol sulfate) .Marland Kitchen... 2 puffs four times a day as needed rescue    Albuterol Sulfate (2.5 Mg/8ml) 0.083% Nebu (Albuterol sulfate) ..... Use as directed as needed  Pulmonary Functions Reviewed: O2 sat: 96 (05/03/2009)     Vaccines Reviewed: Pneumovax: Historical (11/25/2007)   Flu Vax: Fluvax 3+ (04/05/2009)  Complete Medication List: 1)  Qvar 80 Mcg/act Aers (Beclomethasone dipropionate) .... Inhale 2 puffs two times a day 2)  Advair Diskus 250-50 Mcg/dose Misc (Fluticasone-salmeterol) .Marland Kitchen.. 1 puff and rinse, twice daily 3)  Singulair 10 Mg Tabs (Montelukast sodium) .... Take 1 tablet by mouth once a day 4)  Proair Hfa 108 (90 Base) Mcg/act Aers (Albuterol sulfate) .... 2 puffs four times a day as needed rescue 5)  Albuterol Sulfate (2.5 Mg/6ml) 0.083% Nebu (Albuterol sulfate) .... Use as directed as needed 6)  Alphagan P 0.1 % Soln (Brimonidine tartrate) .... Use as directed 7)  Azopt 1 % Susp (Brinzolamide) .... Use as directed 8)  Travatan Z 0.004 % Soln (Travoprost) .... Place 1 drop in each eye at night 9)  Vitamin B-12 1000 Mcg Subl (Cyanocobalamin) .... Take 1 tab once daily 10)  Flintstones Plus Iron Chew (Pediatric multivitamins-iron) .... Take 1 tab two times a day 11)  Biotin 1000 Mcg Tabs (Biotin) .... Take 1 tab once daily 12)  Cvs Calcium Chews 500-200-40 Mg-unt-mcg Chew (Calcium-vitamin d-vitamin k) .... Take 2 chewables once daily 13)  Claritin 10 Mg Tabs (Loratadine) .... Per bottle 14)  Tramadol Hcl 50 Mg Tabs (Tramadol hcl) .Marland Kitchen.. 1 by mouth every 6 hrs. 15)  Flexeril 10 Mg Tabs (Cyclobenzaprine hcl) .... As needed 16)  Augmentin 875-125 Mg Tabs (Amoxicillin-pot clavulanate) .Marland Kitchen.. 1 by mouth two times a day

## 2010-06-12 NOTE — Progress Notes (Signed)
Summary: QUESTION REGARDING LAB WORK  Phone Note Call from Patient Call back at Home Phone [phone]   Caller: Patient Summary of Call: WILL LEAVE SCHOOL AND BE IN TOWN ON FEB 4TH FOR LAB WORK FROM [facility] FOR HER GASTRIC BYPASS DOCTOR  SHE HAS AN APPOINTMENT WITH DR LOWNE FIRST PART OF MARCH AND NEXT DAY AFTER DR Laury Axon WITH THE GASTRIC BYPASS DOCTOR---SHE WOULD LIKE DR LOWNE'S NURSE TO CALL HER AND SEE WHAT LAB WORK SHE IS ALREADY HAVING AND TO SEE IF DR LOWNE WANTS TO ADD ANYTHING TO THAT LIST  THAT WAY SHE CAN HAVE LAB WORK FOR BOTH DOCTORS DONE IN FEB SO THAT SHE CAN TALK ABOUT THE RESULTS WITH BOTH DOCTORS IN [facility] Initial call taken by: Jerolyn Shin,  May 19, 2009 3:12 PM  Follow-up for Phone Call        Is there anything you would like to add to what she is having done at [facility]- Dr.Marting ordered- CMP, lipid profile,cbcd,hgba1c,iron/ibc,vitamin b12 Follow-up by: Army Fossa CMA,  May 19, 2009 3:35 PM  Additional Follow-up for Phone Call Additional follow up Details #1::        vita D,ibc  Additional Follow-up by: Loreen Freud DO,  May 19, 2009 4:28 PM    Additional Follow-up for Phone Call Additional follow up Details #2::    pt is aware. Army Fossa CMA  May 22, 2009 8:46 AM

## 2010-06-12 NOTE — Letter (Signed)
Summary: [facility]   Imported By: Sherian Rein 08/14/2009 11:41:44  _____________________________________________________________________  External Attachment:    Type:   Image     Comment:   External Document

## 2010-06-12 NOTE — Progress Notes (Signed)
Summary: rx prednisone  Phone Note Call from Patient Call back at Home Phone [phone]   Caller: Patient Call For: Anna [name] Reason for Call: Talk to Nurse Summary of Call: fighting upper sinus & chest tightnes.  In [place] and won't be back until next week.  Can you call something in? [facility] - [phone] [address] [place] Initial call taken by: Eugene Gavia,  July 14, 2009 9:29 AM  Follow-up for Phone Call        pt c/o sinus congestion, not able to blow anything out, non prod cough, chest feels tight no sob---pls advise allergies-NKDA   Philipp Deputy CMA  July 14, 2009 9:50 AM     Additional Follow-up for Phone Call Additional follow up Details #1::        prednisone 10 mg # 20, 1 tab four times daily x 2 days, 3 times daily x 2 days, 2 times daily x 2 days, 1 time daily x 2 days  Additional Follow-up by: Waymon Budge MD,  July 14, 2009 1:33 PM    Additional Follow-up for Phone Call Additional follow up Details #2::    rx sent. pt aware.Avanti Jetter CMA  July 14, 2009 1:54 PM   New/Updated Medications: PREDNISONE 10 MG TABS (PREDNISONE) 1 tab four times daily x 2 days, 3 times daily x 2 days, 2 times daily x 2 days, 1 time daily x 2 days Prescriptions: PREDNISONE 10 MG TABS (PREDNISONE) 1 tab four times daily x 2 days, 3 times daily x 2 days, 2 times daily x 2 days, 1 time daily x 2 days  #20 x 0   Entered by:   Carron Curie CMA   Authorized by:   Waymon Budge MD   Signed by:   Carron Curie CMA on 07/14/2009   Method used:   [facility]        [id number]       [address]       [place], [place]  [address]       Ph: [phone] or [phone]       Fax: [phone]   RxID:   [id number]

## 2010-06-14 NOTE — Assessment & Plan Note (Signed)
Summary: rov 1 yr ///kp   Primary Provider/Referring Provider:  Laury Axon  CC:  35 year f/u appt.  c/o productive cough and fever "on and off."  would like to discuss switching from proair to ventolin.  currently smoking 1 ppd.  History of Present Illness: 01/19/09 hx OSA, dyspnea, tobacco Going to [employer]. Stress. Has been smoking 1 ppd again. 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 Has caught mild cold with musty school room "? allergy". Previous allergy testing reviewed from Dr Willa Rough, without allergy vaccine discussed. Xanax has helped.  May 03, 2009- Hx OSA/quit cpap, dyspnea, tobacco, Scimitar synd Noticing more postnasal drip- indoor heat. Uses humidifier. Mild morning cough, without wheeze. nothing puruulent. With weight loss she no longer snores. Gastric bypass did well for her. 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 Got flu vax. Had one pneumovax 2009.  She is hopeful that while away , she can quit cigs Uses some claritin D  Has sinus saline squeeze bottle.  May 03, 2010-  Hx OSA/quit cpap, dyspnea, tobacco, Scimitar synd Hx + PPD. Needed documentation for school so got CXR. - NAD, chronic pleural thickening.  She took INH/ B6 x 36 months 2006. Now in CNA school.  Had flu shot here 05/01/10.  Asks help quitting quitting smoking. Chantix seemed to cause cramping shortly after her gastric bypass, but wants to retry. Her partner has stopped. Now at 1 PPD cigs and increased cough. Denies sweat, fever. Morning cough, exposed to colds. Using her neb 2 x daily.  Can't afford other meds.     Preventive Screening-Counseling & Management  Alcohol-Tobacco     Smoking Status: current     Smoking Cessation Counseling: yes     Smoke Cessation Stage: ready     Packs/Day: 1.0     Year Started: 13     Tobacco Counseling: to quit use of tobacco products  Current Medications (verified): 1)  Qvar 80 Mcg/act   Aers (Beclomethasone Dipropionate) .... Inhale 2 Puffs Two Times A Day 2)  Advair Diskus 250-50 Mcg/dose  Misc (Fluticasone-Salmeterol) .Marland Kitchen.. 1 Puff and Rinse, Twice Daily 3)  Singulair 10 Mg  Tabs (Montelukast Sodium) .... Take 1 Tablet By Mouth Once A Day 4)  Proair Hfa 108 (90 Base) Mcg/act Aers (Albuterol Sulfate) .... 2 Puffs Four Times A Day As Needed Rescue 5)  Albuterol Sulfate (2.5 Mg/20ml) 0.083%  Nebu (Albuterol Sulfate) .... Use As Directed As Needed 6)  Alphagan P 0.1 % Soln (Brimonidine Tartrate) .... Use As Directed 7)  Azopt 1 % Susp (Brinzolamide) .... Use As Directed 8)  Travatan Z 0.004 % Soln (Travoprost) .... Place 1 Drop in Each Eye At Night 9)  Vitamin B-12 1000 Mcg Subl (Cyanocobalamin) .... Take 1 Tab Once Daily 10)  Flintstones Plus Iron  Chew (Pediatric Multivitamins-Iron) .... Take 1 Tab Two Times A Day 11)  Biotin 1000 Mcg Tabs (Biotin) .... Take 1 Tab Once Daily 12)  Cvs Calcium Chews 500-200-40 Mg-Unt-Mcg Chew (Calcium-Vitamin D-Vitamin K) .... Take 2 Chewables Once Daily 13)  Claritin 10 Mg Tabs (Loratadine) .... Per Bottle 14)  Tramadol Hcl 50 Mg Tabs (Tramadol Hcl) .Marland Kitchen.. 1 By Mouth Every 6 Hrs. As Needed 15)  Flexeril 10 Mg Tabs (Cyclobenzaprine Hcl) .... As Needed 16)  Otc Sleep Aid or Tylenol Pm .... 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) TOBACCO ABUSE (ICD-305.1) COPD (ICD-496) COPD Current Problems:  BARIATRIC SURGERY STATUS (ICD-V45.86) BRONCHITIS- ACUTE (ICD-466.0) OBSTRUCTIVE SLEEP APNEA (ICD-327.23) PREVENTIVE HEALTH CARE (ICD-V70.0) GLAUCOMA (ICD-365.9) FAMILY HISTORY DEPRESSION (ICD-V17.0) FAMILY HISTORY OF CAD FEMALE 1ST DEGREE RELATIVE <50 (ICD-V17.3) FAMILY HISTORY OF CAD FEMALE 1ST DEGREE RELATIVE <60 (ICD-V16.49) ALLERGIC RHINITIS (ICD-477.9) EDEMA (ICD-782.3) G E R D (ICD-530.81) 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) TOBACCO ABUSE (ICD-305.1) COPD (ICD-496)  Past Surgical History: Last updated: 07/18/2009 Partial hysterectomy -oophorectomy 1990 Gastric bypass 6/09  Family History: Last updated: 05/19/2008 COPD Asthma heart disease Family History of CAD Female 1st degree relative <60 Family History of CAD Female 1st degree relative <50 MGF--lung cancr Family History Lung cancer Family History Uterine cancer Family History Depression Family History High cholesterol Family History Hypertension PGM--DMII  Social History: Last updated: 05/03/2010 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  Current Smoker  1ppd Alcohol use-yes Drug use-no Regular exercise-yes  Risk Factors: Alcohol Use: 1 (07/18/2009) Caffeine Use: 1 (07/18/2009) Diet: gastric bypass (07/18/2009) Exercise: yes (07/18/2009)  Risk Factors: Smoking Status: current (05/03/2010) Packs/Day: 1.0 (05/03/2010) Passive Smoke Exposure: no (07/18/2009)  Social History: 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  The patient denies shortness of breath  at rest, productive cough, coughing up blood, chest pain, irregular heartbeats, acid heartburn, indigestion, loss of appetite, weight change, abdominal pain, difficulty swallowing, sore throat, tooth/dental problems, headaches, nasal congestion/difficulty breathing through nose, sneezing, itching, rash, change in color of mucus, and fever.    Vital Signs:  Patient profile:   35 year old female Height:      67 inches Weight:      256.38 pounds BMI:     40.30 O2 Sat:      95 % on Room air Pulse rate:   78 / minute BP sitting:   106 / 70  (left arm) Cuff size:   large  Vitals Entered By: Arman Filter LPN (May 03, 2010 1:33 PM)  O2 Flow:  Room air CC: 1 year f/u appt.  c/o productive cough and fever "on and off."  would like to discuss switching from proair to ventolin.  currently smoking 1 ppd Comments Medications reviewed with patient Arman Filter LPN  May 03, 2010 1:36 PM    Physical Exam  Additional Exam:  General: A/Ox3; pleasant and cooperative, NAD, obese, ,, thick glasses SKIN: no rash, lesions. Tatoo on right calf NODES: no lymphadenopathy HEENT: [place]/AT, EOM- WNL, Conjuctivae- clear, PERRLA, TM-WNL, Nose- snorting, Throat- clear and wnl, Mallampati  II-III NECK: Supple w/ fair ROM, JVD- none, normal carotid impulses w/o bruits Thyroid-  CHEST:Unlabored I&E wheeze HEART: RRR, no m/g/r heard ABDOMEN: overweight, s/p bariatric surgery GEX:BMWU, prominent varices, no edema  NEURO: Grossly intact to observation  CXR  Procedure date:  05/01/2010  Findings:      DG CHEST 2 VIEW - [id number]   Clinical Data: Positive PPD, smoking history   CHEST - 2 VIEW   Comparison: Chest x-ray of 09/07/2007   Findings:   Blunting of the right costophrenic angle again is noted consistent with chronic pleural thickening and volume loss.  The left lung is clear and hyperaerated.  The heart is within normal limits in size. No acute bony abnormality is noted.     IMPRESSION: No active lung disease.  No change in pleural thickening at the right lung base.    Impression & Recommendations:  Problem # 1:  Hx of TUBERCULOSIS EXPOSURE (ICD-V01.1)  No active TB. Because of confirmed positive PPD skin test in past, this test doesn't get repeated. She completed INH 9 month prophyllaxis in 2006 at [facility]. CXR now shows no granulomatous disease.   Problem # 2:  TOBACCO ABUSE (ICD-305.1)  We will restart her with Chantix as discussed.   Her updated medication list for this problem includes:    Chantix Starting Month Pak 0.5 Mg X 11 & 1 Mg X 42 Tabs (Varenicline tartrate) .Marland Kitchen... As directed    Chantix Continuing Month Pak 1 Mg Tabs (Varenicline tartrate) .Marland Kitchen... 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 but pulmonary vascular developmental anomaly creates some opacity on CXR.   Medications Added to Medication List This Visit: 1)  Tramadol Hcl 50 Mg Tabs (Tramadol hcl) .Marland Kitchen.. 1 by mouth every 6 hrs. as needed 2)  Otc Sleep Aid or Tylenol Pm  .... As needed 3)  Chantix Starting Month Pak 0.5 Mg X 11 & 1 Mg X 42 Tabs (Varenicline tartrate) .... As directed 4)  Chantix Continuing Month Pak 1 Mg Tabs (Varenicline tartrate) .... As directed 5)  Zithromax Z-pak 250 Mg Tabs (Azithromycin) .... 2 today then one daily  Other Orders: Est. Patient Level III (53299)  Patient Instructions: 1)  Please schedule a follow-up appointment in 6 months. 2)  Samples of rescue inhaler and Advair 250/ 50 ( 1 puff and rinse, twice daily) 3)  Script to hold for Z pak 4)  CXR- was negative for any sign of TB/ granulomatous disease 5)  Script for Chantix Prescriptions: ZITHROMAX Z-PAK 250 MG TABS (AZITHROMYCIN) 2 today then one daily  #1 pak x 0   Entered and Authorized by:   Waymon Budge MD   Signed by:   Waymon Budge MD on 05/03/2010   Method used:   Print then Give to Patient   RxID:    [id number] CHANTIX CONTINUING MONTH PAK 1 MG TABS (VARENICLINE TARTRATE) As directed  #1 kit x 3   Entered and Authorized by:   Waymon Budge MD   Signed by:   Waymon Budge MD on 05/03/2010   Method used:   Print then Give to Patient   RxID:   [id number] CHANTIX STARTING MONTH PAK 0.5 MG X 11 & 1 MG X 42 TABS (VARENICLINE TARTRATE) As directed  #1 kit x 0   Entered and Authorized by:   Waymon Budge MD   Signed by:   Waymon Budge MD on 05/03/2010   Method used:   Print then Give to Patient   RxID:   [id number]       CXR  Procedure date:  05/01/2010  Findings:      DG CHEST 2 VIEW -  [id number]   Clinical Data: Positive PPD, smoking history   CHEST - 2 VIEW   Comparison: Chest x-ray of 09/07/2007   Findings:   Blunting of the right costophrenic angle again is noted consistent with chronic pleural thickening and volume loss.  The left lung is clear and hyperaerated.  The heart is within normal limits in size. No acute bony abnormality is noted.   IMPRESSION: No active lung disease.  No change in pleural thickening at the right lung base.

## 2010-06-14 NOTE — Miscellaneous (Signed)
Summary: Orders Update-CXR-noted in past of pos PPD/kcw  Clinical Lists Changes  Problems: Added new problem of History of  TUBERCULOSIS EXPOSURE (ICD-V01.1) Orders: Added new Test order of T-2 View CXR (71020TC) - Signed

## 2010-06-14 NOTE — Letter (Signed)
Summary: [facility]  [address]   [place], [place] [address]   Phone: [phone]  Fax: [phone]    05/03/2010  Anna [name] [address] [place], [place]  [address]  To Whom it may concern:    This is to inform you that Ms. Anna [name] has had her yearly flu shot on 05-01-10. If any questions or concerns please call Anna [name] at [phone].       Sincerely,     Anna [name]

## 2010-06-14 NOTE — Letter (Signed)
Summary: [facility]  [address]   [place], [place] [address]   Phone: [phone]  Fax: [phone]    05/01/2010  KIARAH ECKSTEIN [address] [place], [place]  [address]  To Whom it may concern:     This letter is to notify you that Ms. Mottram has previously had a postive PPD skin test and was confirmed negative with Chest Xrays. Therefore another PPD test is not needed; this will be confirmed by a Chest Xray today. If any questions or concerns please call the office at [phone]; you can ask for Vivianne Spence for Dr. Jetty Duhamel.  Results of the Chest Xray will be given to the patient.    Sincerely,     Jason Coop

## 2010-06-14 NOTE — Assessment & Plan Note (Signed)
Summary: flu shot//kcw  Nurse Visit   Allergies: No Known Drug Allergies  Immunizations Administered:  Influenza Vaccine # 1:    Vaccine Type: Fluvax 3+    Site: left deltoid    Mfr: GlaxoSmithKline    Dose: 0.5 ml    Route: IM    Given by: Tammy Scott    Exp. Date: 11/10/2010    Lot #: [id number]    VIS given: 12/05/09 version given May 04, 2010.  Flu Vaccine Consent Questions:    Do you have a history of severe allergic reactions to this vaccine? no    Any prior history of allergic reactions to egg and/or gelatin? no    Do you have a sensitivity to the preservative Thimersol? no    Do you have a past history of Guillan-Barre Syndrome? no    Do you currently have an acute febrile illness? no    Have you ever had a severe reaction to latex? no    Vaccine information given and explained to patient? yes    Are you currently pregnant? no  Orders Added: 1)  Flu Vaccine 51yrs + [90658] 2)  Admin 1st Vaccine [56433]

## 2010-06-22 ENCOUNTER — Encounter: Payer: Self-pay | Admitting: Internal Medicine

## 2010-06-22 ENCOUNTER — Telehealth: Payer: Self-pay | Admitting: Internal Medicine

## 2010-06-23 NOTE — Letter (Signed)
June 22, 2010    RE:  Anna [name], Anna [name] MRN:  [id number]  /  DOB:  [birth year]  To Whom It May Concern:  Anna [name] is under my care for pulmonary management.  Recent spirometry values from June 22, 2010, have been reviewed.  She is able to wear a N-95 mask for occasional use at work.   Sincerely,     Gloriana Piltz D. Maple Hudson, MD, Tonny Bollman, FACP Electronically Signed   CDY/MedQ  DD: 06/22/2010  DT: 06/22/2010  Job #: [id number]

## 2010-06-28 NOTE — Progress Notes (Addendum)
Summary: N95 mask  Phone Note Call from Patient Call back at Home Phone [phone]   Caller: Patient Call For: Coryn Mosso Summary of Call: Florentina Addison, per your request: re: N95 mask "ocassional use" review spirometry from occupational health.  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  Pt picked letter up.

## 2010-08-24 ENCOUNTER — Other Ambulatory Visit (INDEPENDENT_AMBULATORY_CARE_PROVIDER_SITE_OTHER): Payer: 59 | Admitting: Internal Medicine

## 2010-08-24 ENCOUNTER — Ambulatory Visit (INDEPENDENT_AMBULATORY_CARE_PROVIDER_SITE_OTHER): Payer: 59 | Admitting: Internal Medicine

## 2010-08-24 ENCOUNTER — Other Ambulatory Visit (INDEPENDENT_AMBULATORY_CARE_PROVIDER_SITE_OTHER): Payer: 59

## 2010-08-24 ENCOUNTER — Encounter: Payer: Self-pay | Admitting: Internal Medicine

## 2010-08-24 VITALS — BP 112/68 | HR 90 | Temp 98.8°F | Ht 69.0 in | Wt 263.0 lb

## 2010-08-24 DIAGNOSIS — G4700 Insomnia, unspecified: Secondary | ICD-10-CM

## 2010-08-24 DIAGNOSIS — Z0000 Encounter for general adult medical examination without abnormal findings: Secondary | ICD-10-CM

## 2010-08-24 DIAGNOSIS — E785 Hyperlipidemia, unspecified: Secondary | ICD-10-CM

## 2010-08-24 LAB — COMPREHENSIVE METABOLIC PANEL
ALT: 24 U/L (ref 0–35)
AST: 25 U/L (ref 0–37)
Albumin: 3.8 g/dL (ref 3.5–5.2)
CO2: 31 mEq/L (ref 19–32)
Calcium: 9.5 mg/dL (ref 8.4–10.5)
Chloride: 102 mEq/L (ref 96–112)
GFR: 73.64 mL/min (ref >60.00)
Potassium: 4.3 mEq/L (ref 3.5–5.1)

## 2010-08-24 LAB — URINALYSIS
Bilirubin Urine: NEGATIVE
Hgb urine dipstick: NEGATIVE
Ketones, ur: NEGATIVE
Nitrite: NEGATIVE
Total Protein, Urine: NEGATIVE

## 2010-08-24 LAB — URINALYSIS, ROUTINE W REFLEX MICROSCOPIC
Bilirubin Urine: NEGATIVE
Ketones, ur: NEGATIVE
Leukocytes, UA: NEGATIVE
Urine Glucose: NEGATIVE
pH: 5.5 (ref 5.0–8.0)

## 2010-08-24 LAB — LIPID PANEL
Total CHOL/HDL Ratio: 4
VLDL: 19 mg/dL (ref 0.0–40.0)

## 2010-08-24 LAB — CBC WITH DIFFERENTIAL/PLATELET
Basophils Absolute: 0 10*3/uL (ref 0.0–0.1)
Eosinophils Relative: 2.8 % (ref 0.0–5.0)
HCT: 39.8 % (ref 36.0–46.0)
Hemoglobin: 13.7 g/dL (ref 12.0–15.0)
Lymphs Abs: 2.1 10*3/uL (ref 0.7–4.0)
MCV: 94.7 fl (ref 78.0–100.0)
Monocytes Absolute: 0.4 10*3/uL (ref 0.1–1.0)
Monocytes Relative: 4.8 % (ref 3.0–12.0)
Neutro Abs: 5.5 10*3/uL (ref 1.4–7.7)
Platelets: 213 10*3/uL (ref 150.0–400.0)
RDW: 12.4 % (ref 11.5–14.6)

## 2010-08-24 MED ORDER — ZOLPIDEM TARTRATE ER 12.5 MG PO TBCR: 12.5000 mg | EXTENDED_RELEASE_TABLET | Freq: Every evening | ORAL | Status: DC | PRN

## 2010-08-24 NOTE — Assessment & Plan Note (Signed)
Labs odered, she will get her PAP done by her Gyn

## 2010-08-24 NOTE — Assessment & Plan Note (Signed)
Try ambien-cr and pt ed about insomnia

## 2010-08-24 NOTE — Progress Notes (Signed)
  Subjective:    Patient ID: Anna [name], female    DOB: 12-13-1975, 35 y.o.   MRN: [id number]  HPI New to me she is an XXY female who does not have cycles and comes in for a complete physical. She requests that her PAP be done by a Theatre manager. 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  HENT: Negative for facial swelling, neck pain and neck stiffness.   Respiratory: Negative for apnea, cough, choking, chest tightness, shortness of breath, wheezing and stridor.   Cardiovascular: Negative for chest pain, palpitations and leg swelling.  Gastrointestinal: Negative for nausea, vomiting, abdominal pain, diarrhea, constipation, blood in stool, abdominal distention and anal bleeding.  Genitourinary: Negative for dysuria, urgency, frequency, hematuria, decreased urine volume and difficulty urinating.  Musculoskeletal: Negative for myalgias, back pain, joint swelling, arthralgias and gait problem.  Skin: Negative for color change, pallor and rash.  Neurological: Negative for dizziness, tremors, seizures, syncope, facial asymmetry, speech difficulty, weakness, light-headedness, numbness and headaches.  Hematological: Negative for adenopathy. Does not bruise/bleed easily.  Psychiatric/Behavioral: Positive for sleep disturbance. Negative for suicidal ideas, hallucinations, behavioral problems, confusion, self-injury, dysphoric mood, decreased concentration and agitation. The patient is not nervous/anxious and is not hyperactive.        Objective:   Physical Exam  Constitutional: She is oriented to person, place, and time. She appears well-developed and well-nourished. No distress.  HENT:  Head: Normocephalic and atraumatic.  Right Ear: External ear normal.  Left  Ear: External ear normal.  Nose: Nose normal.  Mouth/Throat: Oropharynx is clear and moist. No oropharyngeal exudate.  Eyes: Conjunctivae and EOM are normal. Pupils are equal, round, and reactive to light. Right eye exhibits no discharge. Left eye exhibits no discharge. No scleral icterus.  Neck: Normal range of motion. Neck supple. No JVD present. No thyromegaly present.  Cardiovascular: Normal rate, regular rhythm, normal heart sounds and intact distal pulses.  Exam reveals no gallop and no friction rub.   No murmur heard. Pulmonary/Chest: Effort normal and breath sounds normal. No respiratory distress. She has no wheezes. She has no rales. She exhibits no tenderness.  Abdominal: Soft. Bowel sounds are normal. She exhibits no distension and no mass. There is no tenderness. There is no rebound and no guarding.  Musculoskeletal: Normal range of motion. She exhibits no edema and no tenderness.  Lymphadenopathy:    She has no cervical adenopathy.  Neurological: She is alert and oriented to person, place, and time. She has normal reflexes. No cranial nerve deficit.  Skin: Skin is warm and dry. No rash noted. She is not diaphoretic. No erythema. No pallor.  Psychiatric: She has a normal mood and affect. Her behavior is normal. Thought content normal.          Assessment & Plan:

## 2010-08-24 NOTE — Patient Instructions (Signed)

## 2010-08-26 ENCOUNTER — Encounter: Payer: Self-pay | Admitting: Internal Medicine

## 2010-09-25 NOTE — Op Note (Signed)
NAMESYRITA, DOVEL             ACCOUNT NO.:  [id number]   MEDICAL RECORD NO.:  [id number]          PATIENT TYPE:  INP   LOCATION:  1233                         FACILITY:  [facility]   PHYSICIAN:  Sandria Bales. Ezzard Standing, M.D.  DATE OF BIRTH:  [birth year]   DATE OF PROCEDURE:  12/08/2007  DATE OF DISCHARGE:                               OPERATIVE REPORT   Date of Surgery ??   PREOPERATIVE DIAGNOSIS:  Morbid obesity, BMI approximately 55, status  post Roux-en-Y gastric bypass.   POSTOPERATIVE DIAGNOSIS:  Roux-en-Y gastric bypass.  Normal  gastrojejunal pouch.   PROCEDURES:  Upper endoscopy.   SURGEON:  Sandria Bales. Ezzard Standing, MD   ANESTHESIA:  General endotracheal.   ESTIMATED BLOOD LOSS:  Minimal.   PROCEDURE:  Ms. Oestreicher is a 35 year old white female who is a patient of  Dr. Luretha Murphy, who has completed a laparoscopic Roux-en-Y gastric  bypass.  I am doing an upper endoscopy for documentation of the pouch or  any ischemia, any bleeding, or leak.   PROCEDURE NOTE:  A flexible Olympus endoscope was passed without  difficulty down her esophagus into the gastric pouch.  I visualized the  gastroesophageal junction at about 42-43 cm.  Her gastrojejunal  anastomosis about 49 cm for about a 6-7 cm pouch.  There was no bleeding  for the pouch.  There was no evidence of any ischemia for the pouch.  I  insufflated air into the pouch while  Dr. Daphine Deutscher clamped off the  jejunum.  He flooded the upper abdomen with saline and saw no bubbles or  air leak.  The gastroesophageal anastomosis was patent without leak.   I did take photos of the pouch.  The scope was withdrawn.  There was no  esophageal injury.   The patient tolerated the procedure well.  Dr. Daphine Deutscher will dictate the  Roux-en-Y gastric bypass.      Sandria Bales. Ezzard Standing, M.D.  Electronically Signed     DHN/MEDQ  D:  12/08/2007  T:  12/09/2007  Job:  [id number]   cc:   Thornton Park Daphine Deutscher, MD

## 2010-09-25 NOTE — Op Note (Signed)
[name], [name]             ACCOUNT NO.:  [id number]   MEDICAL RECORD NO.:  [id number]          PATIENT TYPE:  INP   LOCATION:  0002                         FACILITY:  [facility]   PHYSICIAN:  Thornton Park. Daphine Deutscher, MD  DATE OF BIRTH:  1975/06/30   DATE OF PROCEDURE:  DATE OF DISCHARGE:                               OPERATIVE REPORT   PREOPERATIVE INDICATIONS:  Morbid obesity with BMI of 55-56.   PROCEDURE:  Laparoscopic Roux-en-Y gastric bypass with a 40 cm BP limb,  100 cm Roux limb, antecolic, antegastric candy cane to the left, closure  of Peterson's defect.   SURGEON:  Thornton Park. Daphine Deutscher, MD.   ASSISTANTMarland Kitchen  Sandria Bales. Ezzard Standing, M.D.   ANESTHESIA:  General endotracheal anesthesia.   OPERATIVE TIME:  Four hours.   DESCRIPTION OF PROCEDURE:  Anna [name] was taken to room #1, given  general anesthesia.  The abdomen was prepped with Techni-Care and draped  sterilely.  I entered the abdomen through the left upper quadrant with a  OptiVu 0 degree.  Insufflation was done and I first looked and did a  survey of  her abdomen.  She had a huge omental apron.  I looked in the  pelvis to look for rudimentary ovaries and saw some structure on the  left side, nothing the right and saw no evidence of a uterus.   We then moved to the mid abdomen were I identified the ligament of  Treitz and then measured 40 cm downstream.  I was working at that time  with 6 trocars standard positions and at about 40-45 cm I divided the  bowel with a single application of white cartridge Covidien Endo-GIA.  A  Penrose was sutured onto the future Roux limb.  I went ahead and  measured 1 meter Roux limb and then tacked the bowel together in a side-  to-side fashion.  Antimesenteric borders were opened with the harmonic  scalpel and a 60 cm staple was inserted and fired.  The common defect  was closed from either end with 2-0 Vicryl and tied in the middle.  The  mesenteric defect was closed with a running 2-0  silk and then Tisseel  was applied to that anastomosis.   Next I divided the omentum with a harmonic scalpel.  We then put in the  [facility] retractor and I put another retractor up superiorly retracting  the left lateral segment.  I went up and measured about 5 cm down along  the lesser curvature and then dissected and removing everything from the  stomach and then eventually inserting the Ewald tube.  Tubular pouch was  created.  The Roux limb was then brought up and sutured along the back  wall with running 2-0 Vicryl.  Common channels were created opening and  then firing the blue cartridge Endo-GIA.  The common defect was closed  with 2-0 Vicryl.  A second layer running with tie knots at either end  was applied anteriorly with an Ewald tube going across the anastomosis.  Tisseel was then applied.  Peterson's defect was closed with a figure-of-  eight suture of  2-0 silk and then a clamp across the bowel.  Dr. Ezzard Standing  endoscoped the patient revealing about a 5 cm pouch with a good patent  anastomosis.  I submerged the anastomosis and with insufflation, there  was no  evidence of leak.  I withdrew the saline.  The gas was then withdrawn  from the abdomen.  The wound closed with 4-0 Vicryl and with staples.  The patient tolerated the procedure well. She will go to the step-down  unit postop.  She was taken to the recovery room in satisfactory  condition.      Thornton Park Daphine Deutscher, MD  Electronically Signed     MBM/MEDQ  D:  12/08/2007  T:  12/08/2007  Job:  [id number]   cc:   Dorisann Frames, M.D.   Clinton D. Maple Hudson, MD, FCCP, FACP  [facility]-Pulmonary Dept  [address]  [place]  [place] [address]   Colleen Can. Deborah Chalk, M.D.  Fax: [phone]

## 2010-09-25 NOTE — Letter (Signed)
July 04, 2007    Anna [name]  [address]  [place], [place] [address]   RE:  ANSLIE, SPADAFORA  MRN:  [id number]  /  DOB:  1975/10/16   TO WHOM IT MAY CONCERN   Anna [name] has been under my medical care for chronic obstructive  lung disease.  Pulmonary function tests have shown moderate obstruction  to air flow with an FEV-1 58% of predicted and FEV-1/FVC ratio of 0.52.  She does not respond significantly to bronchodilators.  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  Her medical problems include obesity and I have recommended and  referred her for surgical evaluation to address this.    Sincerely,      Clinton D. Maple Hudson, MD, [facility] Bollman, FACP  Electronically Signed    CDY/MedQ  DD: 07/04/2007  DT: 07/05/2007  Job #: [id number]

## 2010-09-25 NOTE — Procedures (Signed)
Anna [name], Anna [name]             ACCOUNT NO.:  [id number]   MEDICAL RECORD NO.:  [id number]          PATIENT TYPE:  OUT   LOCATION:  SLEEP CENTER                 FACILITY:  [facility]   PHYSICIAN:  Clinton D. Maple Hudson, MD, FCCP, FACPDATE OF BIRTH:  Dec 29, 1974   DATE OF STUDY:                            NOCTURNAL POLYSOMNOGRAM   REFERRING PHYSICIAN:  Clinton D. Young, MD, FCCP, FACP   INDICATION FOR STUDY:  Hypersomnia with sleep apnea.   EPWORTH SLEEPINESS SCORE:  12/24, BMI 57.3, weight 388 pounds, height 69  inches, neck 20 inches.   MEDICATIONS:  Home medications charted and reviewed.   SLEEP ARCHITECTURE:  Total sleep time 368 minutes with sleep efficiency  92.2%.  Stage I was 2.4%.  Stage II 68.7%.  Stage III 14.4%.  REM 14.5%  of total sleep time.  Sleep latency 3 minutes.  REM latency 129 minutes.  Awake after sleep onset 26 minutes.  Arousal index 27.7.  No bedtime  medication was taken.   RESPIRATORY DATA:  Apnea/hypopnea index (AHI) 8.1 per hour indicating  mild obstructive sleep apnea/hypopnea syndrome.  A total of 50 events  were scored including 4 obstructive apneas and 46 hypopneas.  The events  were not positional.  REM AHI 31.4.  Diagnostic NPSG protocol was  ordered and followed.   OXYGEN DATA:  Very loud snoring with oxygen desaturation to a nadir of  77%.  Mean oxygen saturation through the study was 92.2% on room air.  A  total of 29.5 minutes were spent with saturation less than 88%.   CARDIAC DATA:  Sinus rhythm with occasional PAC.   MOVEMENT-PARASOMNIA:  No significant movement disturbance.  No bathroom  trips.   IMPRESSIONS-RECOMMENDATIONS:  1. Mild obstructive sleep apnea/hypopnea syndrome, AHI 8.1 per hour      with loud snoring nonpositional events and oxygen desaturation to a      nadir of 77%.  Note that a total of 29.5 minutes were spent with      saturation less than 88% during this study.  2. Scores in this range may justify consideration of CPAP  especially      with loud snoring and oxygen desaturation.     Clinton D. Maple Hudson, MD, [facility], FACP  Diplomate, Biomedical engineer of Sleep Medicine  Electronically Signed    CDY/MEDQ  D:  09/12/2007 12:28:06  T:  09/12/2007 12:55:52  Job:  [id number]

## 2010-09-25 NOTE — Assessment & Plan Note (Signed)
[facility]                             PULMONARY OFFICE NOTE   NAME:Anna [name], Anna [name]                    MRN:          [id number]  DATE:03/05/2007                            DOB:          March 31, 1976    HISTORY OF PRESENT ILLNESS:  The patient is a 35 year old patient of Dr.  Maple Hudson who has a known history of COPD in an active smoker, who presents  for an acute office visit.  The patient complains over the last three  weeks that she has had increased minimally productive cough with thick  mucous, wheezing, shortness of breath with activity, and chest  tightness.  Denies any hemoptysis, orthopnea, fever, purulent sputum or  leg swelling.   PAST MEDICAL HISTORY:  Reviewed.   CURRENT MEDICATIONS:  Reviewed.   PHYSICAL EXAMINATION:  GENERAL:  The patient is pleasant and in no acute  distress.  VITAL SIGNS:  She is afebrile with stable vital signs.  O2 saturations  97% on room air.  HEENT:  Nasal mucosa with some mild erythema.  Nontender sinuses.  Posterior pharynx is clear.  NECK:  Supple without cervical adenopathy.  No JVD.  LUNGS:  Coarse breath sounds bilaterally with a few expiratory wheezes.  HEART:  Regular rate and rhythm.  ABDOMEN:  Soft, obese, and nontender.  EXTREMITIES:  Warm without any edema.   IMPRESSION:  Acute chronic obstructive pulmonary disease exacerbation.  The patient is encouraged on smoking cessation.  She is to begin  prednisone pack over the next week.  Add in Mucinex DM twice daily.  Zyrtec 10 mg at bedtime as needed for postnasal drip symptoms.  The  patient will return back with Dr. Maple Hudson in one month or sooner if  needed.      Rubye Oaks, NP  Electronically Signed      Clinton D. Maple Hudson, MD, Tonny Bollman, FACP  Electronically Signed   TP/MedQ  DD: 03/05/2007  DT: 03/06/2007  Job #: [id number]

## 2010-09-25 NOTE — Letter (Signed)
July 10, 2007     RE:  Anna [name], Anna [name]  MRN:  [id number]  /  DOB:  18-Oct-1975   To whom it may concern:   Anna [name] is under my medical care as previously documented with  significant dyspnea reflecting asthma with COPD complicated by exogenous  obesity.  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  I do expect that as she is able to lose  weight, her breathing will substantially improve and she is expected to  be able to return to unrestricted activity.    Sincerely,      Clinton D. Maple Hudson, MD, Tonny Bollman, FACP  Electronically Signed    CDY/MedQ  DD: 07/11/2007  DT: 07/12/2007  Job #: [id number]

## 2010-09-28 NOTE — Letter (Signed)
April 30, 2007     RE:  Anna [name], Anna [name]  MRN:  [id number]  /  DOB:  01/29/1976   To Whom it May Concern:   RE:  Bariatric surgery   This is a 35 year old obese female followed by Dr. Robert Bellow for  primary care and myself for asthma.  She has a long-standing history of  morbid obesity.  My earliest available weight was from June 18, 2005  when she weighed over the 350 pound limit of our scale.  The most recent  weight was on April 23, 2007 when she weighed 374 pounds.   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   Pulmonary function tests on June 19, 2005 had recorded hyperinflation  and air trapping with an FEV1 of 1.92 L (58% predicted), FEV1/FVC of  0.52, and diffusion capacity increased at 148 of percent of predicted.  On a 6-minute walk test on July 04, 2005, done for fit testing of a  respirator mask for work, her blood pressure rose from 124/88 to 144/82,  heart rate rose from 75 to 116, and 2 minutes later is 91.  Room air  oxygen saturation fell from 98% to 96% by end of test which is  insignificant, and rebounded to 98% at 2 minutes of rest.  She walked  432 meters in 6 minutes and was cleared to wear a fitness mask at that  time.  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  She now does office work.   It is my impression that her obesity is severe, chronic, and disabling.  It represents a significant component of her exercise limitation and  interferes with her ability to work in her chosen field.  Weight loss  would be helpful.    Sincerely,      Clinton D. Maple Hudson,  MD, Ton[facility] Bollman, FACP  Electronically Signed    CDY/MedQ  DD: 04/30/2007  DT: 05/01/2007  Job #: [id number]

## 2010-09-28 NOTE — Discharge Summary (Signed)
. [facility]  Patient:    Anna [name], BARK Visit Number: [id number] MRN: [id number]          Service Type: MED Location: 5000 [id number] Attending Physician:  Phifer, Trinna Post Dictated by:   Ladell Pier, M.D. Admit Date:  05/01/2001 Discharge Date: 05/04/2001                             Discharge Summary  DISCHARGE DIAGNOSES: 1. Right lower lobe pneumonia. 2. Nausea and vomiting. 3. Testicular feminization diagnosed at 35 years old. 4. History of bronchitis. 5. Tobacco abuse.  DISCHARGE MEDICATIONS: 1. Tequin 400 mg p.o. q.d. x 8 days. 2. Prescilla Sours ophthalmic solution q.h.s. 3. Asopt ophthalmic solution b.i.d. 4. Alphagan ophthalmic solution b.i.d. 5. Lotemex ophthalmic solution p.r.n.  FOLLOWUP:  The patient was told to call the outpatient clinic at [phone], for a follow-up appointment with Dr. Olena Leatherwood in outpatient clinic.  If appointment is not available for January, to ask for follow-up appointment with Dr. Marlan Palau.  CONSULTATIONS:  None.  PROCEDURES:  None.  HISTORY OF PRESENT ILLNESS:  Ms. Anna [name] is a 35 year old white female with past medical history significant for testicular feminization diagnosed at the age of 20, history of bronchitis, she presented with complaints of cough productive of greenish sputum, fever and chills, plus myalgia for the past six days.  She has had sniffles over the past month.  She was seen in the emergency department two days prior to admission by Dr. Gerlean Ren and Dr. Marlan Palau, and started on Tequin p.o. q.d.  The patient complained of nausea and vomiting for the past 3 days which she could not keep down the antibiotics.  No hematemesis, no hemoptysis, no abdominal pain.  PAST MEDICAL HISTORY: 1. Glaucoma diagnosed at age 59. 2. History of testicular feminization diagnosed at age 53, status post    bilateral oophorectomy. 3. Chronic bronchitis. 4. Seasonal allergies. 5. Legally blind in the right  eye.  PAST SURGICAL HISTORY: 1. Positive for glaucoma, status post three surgeries in the right eye, plus    one surgery on the left eye. 2. Bilateral oophorectomy at age 52 for testicular feminization.  FAMILY HISTORY:  Grandfather died of a heart attack, first myocardial infarction in his 87s.  Lung cancer in grandmother, pacemaker in grandmother. Father had myocardial infarction at age 53.  SOCIAL HISTORY:  The patient works in the emergency department.  Married, from [place].  No primary care physician.  PHYSICAL EXAMINATION:  GENERAL:  The patient is alert and oriented x 3, in no acute distress.  VITAL SIGNS:  Temperature 99.3, pulse 106, blood pressure 138/74, respiratory rate 16, pulse oximetry 98% on room air.  HEENT:  Normocephalic, atraumatic.  Pupils are equal, round and reactive to light.  No icterus.  Oropharynx moist.  NECK:  No LAD, no JVD, no thyromegaly.  LUNGS:  Decreased breath sounds in the right base.  A few crackles on the right.  A few wheezes bilaterally.  HEART:  Tachycardic with no murmurs, rubs, or gallops.  ABDOMEN:  Soft, obese, nontender, nondistended, positive bowel sounds.  EXTREMITIES:  No edema.  Pulses 1+ bilaterally.  NEUROLOGIC:  No focal neurologic deficits.  HOSPITAL COURSE:  #1 - PNEUMONIA:  The patient had a chest x-ray that showed infiltrate, right pleural effusion.  On repeat, lateral decube, there was no layering of effusion, it was questioned whether the patient had a loculated pleural effusion.  Over the  course of the hospitalization the patient was treated with IV Zosyn and Zithromax for presumed community acquired pneumonia plus atypicals.  She improved over the course of her hospitalization.  She was treated with albuterol and Atrovent nebulizers, and oxygen.  Sputum culture and blood cultures were negative.  Her Gram stain showed gram positive diplococci, a moderate amount of white blood cells, a few gram negative  rods. The patient was discharged home because she was asymptomatic at this time, and she would follow up within a month to repeat her chest x-ray.  She was told to call if symptoms returned.  #2 - GLAUCOMA:  The patient was continued on her home eye drops during her hospitalization.  #3 - DEHYDRATION:  The patient was rehydrated with IV fluids during her hospital stay.  DISCHARGE LABORATORY DATA:  Chest x-ray showed right lower lobe infiltrate. Blood cultures negative.  ANA pending.  On discharge respiratory culture showed normal oropharyngeal flora.  Sputum Gram stain moderate white blood cells, few gram positive cocci in pairs, and a few gram negative rods.  CBC on discharge showed white blood cell count of 8.8, hemoglobin and hematocrit 14.6 and 41.9, platelets 198.  Sodium 140, potassium 4.3, chloride 102, CO2 30, BUN 12, creatinine 0.9, glucose 102.  The patient had several chest x-rays.  First x-ray showed pleural effusion, and repeat chest x-ray showed no layering on lateral decube.  DISPOSITION:  The patient was discharged home stable. Dictated by:   Ladell Pier, M.D. Attending Physician:  Phifer, Trinna Post DD:  05/04/01 TD:  05/06/01 Job: [id number]

## 2010-11-26 ENCOUNTER — Ambulatory Visit: Payer: 59 | Admitting: Internal Medicine

## 2010-12-09 ENCOUNTER — Ambulatory Visit (INDEPENDENT_AMBULATORY_CARE_PROVIDER_SITE_OTHER): Payer: 59

## 2010-12-09 ENCOUNTER — Inpatient Hospital Stay (INDEPENDENT_AMBULATORY_CARE_PROVIDER_SITE_OTHER)
Admission: RE | Admit: 2010-12-09 | Discharge: 2010-12-09 | Disposition: A | Discharge status: Home / Self Care | Payer: 59 | Source: Ambulatory Visit | Attending: Family Medicine | Admitting: Family Medicine

## 2010-12-09 DIAGNOSIS — IMO0002 Reserved for concepts with insufficient information to code with codable children: Secondary | ICD-10-CM

## 2010-12-09 DIAGNOSIS — X58XXXA Exposure to other specified factors, initial encounter: Secondary | ICD-10-CM

## 2011-01-30 ENCOUNTER — Telehealth: Payer: Self-pay | Admitting: Internal Medicine

## 2011-01-30 NOTE — Telephone Encounter (Signed)
LM on named VM advising 1 sample has been left up front for p/u

## 2011-02-05 LAB — CBC
HCT: 38.2
Hemoglobin: 12.9
MCV: 89.8
RBC: 4.25
WBC: 11.2 — ABNORMAL HIGH

## 2011-02-05 LAB — B-NATRIURETIC PEPTIDE (CONVERTED LAB): Pro B Natriuretic peptide (BNP): 78

## 2011-02-05 LAB — POCT I-STAT, CHEM 8
Chloride: 104
HCT: 39
Hemoglobin: 13.3
Potassium: 3.8

## 2011-02-08 LAB — CBC
HCT: 39.3
MCHC: 34.2
MCV: 93.1
Platelets: 207
Platelets: 231
RDW: 13.5
RDW: 13.7
WBC: 14.7 — ABNORMAL HIGH

## 2011-02-08 LAB — DIFFERENTIAL
Basophils Absolute: 0
Basophils Absolute: 0
Basophils Relative: 0
Basophils Relative: 0
Eosinophils Absolute: 0
Eosinophils Relative: 0
Lymphocytes Relative: 18
Lymphs Abs: 1
Monocytes Absolute: 0.8
Neutro Abs: 9.8 — ABNORMAL HIGH
Neutrophils Relative %: 75
Neutrophils Relative %: 91 — ABNORMAL HIGH

## 2011-02-08 LAB — BASIC METABOLIC PANEL
BUN: 17
Chloride: 101
Creatinine, Ser: 0.83
GFR calc Af Amer: >60
GFR calc non Af Amer: >60
Potassium: 4.2

## 2011-02-08 LAB — HEMOGLOBIN AND HEMATOCRIT, BLOOD
HCT: 37.8
HCT: 40.2
HCT: 40.7
Hemoglobin: 12.9
Hemoglobin: 14.1

## 2011-02-14 ENCOUNTER — Ambulatory Visit (INDEPENDENT_AMBULATORY_CARE_PROVIDER_SITE_OTHER): Payer: 59 | Admitting: Internal Medicine

## 2011-02-14 ENCOUNTER — Ambulatory Visit: Payer: 59 | Admitting: Internal Medicine

## 2011-02-14 ENCOUNTER — Encounter: Payer: Self-pay | Admitting: Internal Medicine

## 2011-02-14 DIAGNOSIS — J45901 Unspecified asthma with (acute) exacerbation: Secondary | ICD-10-CM | POA: Insufficient documentation

## 2011-02-14 DIAGNOSIS — J449 Chronic obstructive pulmonary disease, unspecified: Secondary | ICD-10-CM

## 2011-02-14 DIAGNOSIS — J209 Acute bronchitis, unspecified: Secondary | ICD-10-CM | POA: Insufficient documentation

## 2011-02-14 DIAGNOSIS — G4700 Insomnia, unspecified: Secondary | ICD-10-CM

## 2011-02-14 MED ORDER — ZOLPIDEM TARTRATE ER 12.5 MG PO TBCR: 12.5000 mg | EXTENDED_RELEASE_TABLET | Freq: Every evening | ORAL | Status: AC | PRN

## 2011-02-14 MED ORDER — BECLOMETHASONE DIPROPIONATE 80 MCG/ACT IN AERS: 2.0000 | INHALATION_SPRAY | Freq: Two times a day (BID) | RESPIRATORY_TRACT | Status: DC

## 2011-02-14 MED ORDER — ALBUTEROL SULFATE HFA 108 (90 BASE) MCG/ACT IN AERS: 2.0000 | INHALATION_SPRAY | Freq: Four times a day (QID) | RESPIRATORY_TRACT | Status: AC

## 2011-02-14 MED ORDER — ALBUTEROL SULFATE (2.5 MG/3ML) 0.083% IN NEBU: 2.5000 mg | INHALATION_SOLUTION | RESPIRATORY_TRACT | Status: DC

## 2011-02-14 MED ORDER — MOXIFLOXACIN HCL 400 MG PO TABS: 400.0000 mg | ORAL_TABLET | Freq: Every day | ORAL | Status: AC

## 2011-02-14 MED ORDER — PSEUDOEPH-CHLORPHEN-HYDROCOD 60-4-5 MG/5ML PO SOLN: 5.0000 mL | Freq: Four times a day (QID) | ORAL | Status: AC | PRN

## 2011-02-14 MED ORDER — METHYLPREDNISOLONE ACETATE 80 MG/ML IJ SUSP
120.0000 mg | Freq: Once | INTRAMUSCULAR | Status: AC
  Administered 2011-02-14: 120 mg via INTRAMUSCULAR

## 2011-02-14 NOTE — Patient Instructions (Signed)
Acute Bronchitis You have acute bronchitis. This means you have a chest cold. The airways in your lungs are inflamed (red and sore). Acute means it is sudden onset. Bronchitis is most often caused by a virus. In smokers, people with chronic lung problems, and elderly patients, treatment with antibiotics for bacterial infection may be needed. Exposure to cigarette smoke or irritating chemicals will make bronchitis worse. Allergies and asthma can also make bronchitis worse. Repeated episodes of bronchitis may cause long standing lung problems. Acute bronchitis is usually treated with rest, fluids, and medicines for relief of fever or cough. Bronchodilator medicines from metered inhalers or a nebulizer may be used to help open up the small airways. This reduces shortness of breath and helps control cough. Antibiotics can be prescribed if you are more seriously ill or at risk. A cool air vaporizer may help thin bronchial secretions and make it easier to clear your chest. Increased fluids may also help. You must avoid smoking, even second hand exposure. If you are a cigarette smoker, consider using nicotine gum or skin patches to help control withdrawal symptoms. Recovery from bronchitis is often slow, but you should start feeling better after 2-3 days. Cough from bronchitis frequently lasts for 3-4 weeks.  SEEK IMMEDIATE MEDICAL CARE IF YOU DEVELOP:  Increased fever, chills, or chest pain.   Severe shortness of breath or bloody sputum.   Dehydration, fainting, repeated vomiting, severe headache.   No improvement after one week of proper treatment.  MAKE SURE YOU:   Understand these instructions.   Will watch your condition.   Will get help right away if you are not doing well or get worse.  Document Released: 06/06/2004 Document Re-Released: 04/11/2008 [employer] Patient Information 2011 [employer]. 

## 2011-02-15 NOTE — Assessment & Plan Note (Signed)
Continue ambien

## 2011-02-15 NOTE — Assessment & Plan Note (Addendum)
Start avelox for the infection and zutripro for the cough and congestion

## 2011-02-15 NOTE — Assessment & Plan Note (Signed)
She was given depo-medrol IM to reduce the inflammation and wheezing, she will restart her inhalers

## 2011-02-15 NOTE — Progress Notes (Signed)
Subjective:    Patient ID: Anna [name], female    DOB: [birth year], 35 y.o.   MRN: [id number]  URI  This is a new problem. The current episode started in the past 7 days. The problem has been gradually worsening. There has been no fever. Associated symptoms include congestion, coughing (productive of yellow phlegm), headaches, rhinorrhea, sinus pain, a sore throat and wheezing. Pertinent negatives include no abdominal pain, chest pain, diarrhea, dysuria, ear pain, joint pain, joint swelling, nausea, neck pain, plugged ear sensation, rash, sneezing, swollen glands or vomiting. She has tried nothing for the symptoms. The treatment provided no relief.      Review of Systems  Constitutional: Negative for fever, chills, diaphoresis, activity change, appetite change, fatigue and unexpected weight change.  HENT: Positive for congestion, sore throat, rhinorrhea and sinus pressure. Negative for ear pain, nosebleeds, sneezing, drooling, trouble swallowing, neck pain, voice change, tinnitus and ear discharge.   Eyes: Negative.   Respiratory: Positive for cough (productive of yellow phlegm) and wheezing. Negative for chest tightness, shortness of breath and stridor.   Cardiovascular: Negative for chest pain, palpitations and leg swelling.  Gastrointestinal: Negative.  Negative for nausea, vomiting, abdominal pain, diarrhea, constipation, blood in stool, abdominal distention, anal bleeding and rectal pain.  Genitourinary: Negative for dysuria, urgency, hematuria, decreased urine volume, enuresis, difficulty urinating and dyspareunia.  Musculoskeletal: Negative for back pain, joint pain, joint swelling, arthralgias and gait problem.  Skin: Negative for color change, pallor, rash and wound.  Neurological: Positive for headaches. Negative for dizziness, tremors, seizures, syncope, facial asymmetry, speech difficulty, weakness, light-headedness and numbness.  Hematological: Negative for adenopathy. Does not  bruise/bleed easily.  Psychiatric/Behavioral: Positive for sleep disturbance. Negative for suicidal ideas, hallucinations, behavioral problems, confusion, self-injury, dysphoric mood, decreased concentration and agitation. The patient is not nervous/anxious and is not hyperactive.        Objective:   Physical Exam  Vitals reviewed. Constitutional: She is oriented to person, place, and time. She appears well-developed and well-nourished. No distress.  HENT:  Right Ear: Hearing, tympanic membrane, external ear and ear canal normal.  Left Ear: Hearing, tympanic membrane, external ear and ear canal normal.  Nose: Mucosal edema and rhinorrhea present. No nose lacerations, sinus tenderness, nasal deformity, septal deviation or nasal septal hematoma. No epistaxis.  No foreign bodies. Right sinus exhibits maxillary sinus tenderness. Right sinus exhibits no frontal sinus tenderness. Left sinus exhibits maxillary sinus tenderness. Left sinus exhibits no frontal sinus tenderness.  Mouth/Throat: Mucous membranes are normal. Mucous membranes are not pale, not dry and not cyanotic. No oropharyngeal exudate, posterior oropharyngeal edema, posterior oropharyngeal erythema or tonsillar abscesses.  Eyes: Conjunctivae are normal. Right eye exhibits no discharge. Left eye exhibits no discharge. No scleral icterus.  Neck: Normal range of motion. Neck supple. No JVD present. No tracheal deviation present. No thyromegaly present.  Cardiovascular: Normal rate, regular rhythm, normal heart sounds and intact distal pulses.  Exam reveals no gallop and no friction rub.   No murmur heard. Pulmonary/Chest: Effort normal. No accessory muscle usage or stridor. Not tachypneic. No respiratory distress. She has no decreased breath sounds. She has wheezes in the right middle field and the left middle field. She has rhonchi in the right middle field and the left middle field. She has no rales. She exhibits no tenderness.  Abdominal:  Soft. Bowel sounds are normal. She exhibits no distension. There is no tenderness. There is no rebound and no guarding.  Musculoskeletal: Normal range of motion. She exhibits  no edema and no tenderness.  Lymphadenopathy:    She has no cervical adenopathy.  Neurological: She is oriented to person, place, and time. She exhibits normal muscle tone.  Skin: Skin is warm and dry. No rash noted. She is not diaphoretic. No erythema. No pallor.  Psychiatric: She has a normal mood and affect. Her behavior is normal. Judgment and thought content normal.          Assessment & Plan:

## 2011-02-20 LAB — I-STAT 8, (EC8 V) (CONVERTED LAB)
Bicarbonate: 24.6 — ABNORMAL HIGH
Chloride: 107
HCT: 44
Operator id: 196461
pCO2, Ven: 37.7 — ABNORMAL LOW
pH, Ven: 7.422 — ABNORMAL HIGH

## 2011-02-20 LAB — POCT I-STAT CREATININE
Creatinine, Ser: 1
Operator id: 196461

## 2011-02-20 LAB — POCT CARDIAC MARKERS: Myoglobin, poc: 80

## 2011-02-20 LAB — D-DIMER, QUANTITATIVE: D-Dimer, Quant: 0.59 — ABNORMAL HIGH

## 2011-02-26 LAB — I-STAT 8, (EC8 V) (CONVERTED LAB)
Bicarbonate: 29.2 — ABNORMAL HIGH
Glucose, Bld: 99
TCO2: 31
pH, Ven: 7.39 — ABNORMAL HIGH

## 2011-02-26 LAB — POCT CARDIAC MARKERS
Myoglobin, poc: 121
Operator id: 282201
Troponin i, poc: <0.05

## 2011-02-26 LAB — POCT I-STAT CREATININE: Operator id: 282201

## 2011-02-26 LAB — DIFFERENTIAL
Eosinophils Absolute: 0.1
Lymphs Abs: 2.8
Monocytes Absolute: 0.7
Monocytes Relative: 5
Neutro Abs: 10.5 — ABNORMAL HIGH
Neutrophils Relative %: 74

## 2011-02-26 LAB — CBC
Hemoglobin: 14.6
MCV: 89.7
RBC: 4.88
WBC: 14.1 — ABNORMAL HIGH

## 2011-03-04 ENCOUNTER — Ambulatory Visit: Payer: 59 | Admitting: Internal Medicine

## 2011-03-04 DIAGNOSIS — Z0289 Encounter for other administrative examinations: Secondary | ICD-10-CM

## 2011-03-12 ENCOUNTER — Ambulatory Visit: Payer: 59 | Admitting: Internal Medicine

## 2011-06-25 ENCOUNTER — Emergency Department (HOSPITAL_COMMUNITY)
Admission: EM | Admit: 2011-06-25 | Discharge: 2011-06-25 | Disposition: A | Discharge status: Home / Self Care | Payer: 59 | Source: Home / Self Care | Attending: Family Medicine | Admitting: Family Medicine

## 2011-06-25 ENCOUNTER — Encounter (HOSPITAL_COMMUNITY): Payer: Self-pay

## 2011-06-25 DIAGNOSIS — J209 Acute bronchitis, unspecified: Secondary | ICD-10-CM

## 2011-06-25 DIAGNOSIS — R0789 Other chest pain: Secondary | ICD-10-CM

## 2011-06-25 HISTORY — DX: Reserved for concepts with insufficient information to code with codable children: IMO0002

## 2011-06-25 MED ORDER — CEFDINIR 300 MG PO CAPS: 300.0000 mg | ORAL_CAPSULE | Freq: Two times a day (BID) | ORAL | Status: AC

## 2011-06-25 MED ORDER — HYDROCOD POLST-CHLORPHEN POLST 10-8 MG/5ML PO LQCR: 5.0000 mL | Freq: Two times a day (BID) | ORAL | Status: AC

## 2011-06-25 NOTE — ED Notes (Signed)
C/o ante. rt lower rib pain.  States she quit smoking 2 weeks ago.  States she has always had smoker's cough. Cough has worsened in last 2-3 days and has had pain in ante.  Rt lower ribs that is worse to palpation, deep breathing, movement and coughing.

## 2011-06-25 NOTE — Discharge Instructions (Signed)
Take all of medicine, drink lots of fluids, no more smoking, mucinex daily, see your doctor if further problems °

## 2011-06-25 NOTE — ED Provider Notes (Signed)
History     CSN: [id number]  Arrival date & time 06/25/11  4098   First MD Initiated Contact with Patient 06/25/11 0825      Chief Complaint  Patient presents with  . Chest Pain    (Consider location/radiation/quality/duration/timing/severity/associated sxs/prior treatment) Patient is a 36 y.o. female presenting with chest pain. The history is provided by the patient.  Chest Pain The chest pain began 2 days ago. Chest pain occurs frequently. The chest pain is unchanged. The pain is associated with coughing (movement and palpation aggravate sx, ). The severity of the pain is mild. The pain does not radiate. Chest pain is worsened by certain positions (quit smoking 2 weeks ago.). Primary symptoms include cough. Pertinent negatives for primary symptoms include no fever, no nausea and no vomiting.     Past Medical History  Diagnosis Date  . GERD (gastroesophageal reflux disease)   . Edema   . Obstructive sleep apnea (adult) (pediatric)   . Allergy   . Glaucoma   . Scimitar syndrome   . Obesity, unspecified   . Tobacco use disorder   . Family history of psychiatric condition   . Family history of ischemic heart disease   . Sleep apnea     NPSG 09/09/07 AHI 8.1/hr prolong desat  . COPD (chronic obstructive pulmonary disease)     PFT 07/22/08-FEV1/FVC 0.49  . Pneumonia     hosp 2002  . Degenerative disc disease     Past Surgical History  Procedure Date  . Abdominal hysterectomy     partial- oophorectomy 1990  . Gastric bypass 6/09  . Eye surgery     Family History  Problem Relation Age of Onset  . Cancer Maternal Grandfather     lung cancer  . Diabetes Paternal Grandmother     type 2  . COPD Other   . Asthma Other   . Heart disease Other     CAD  . Cancer Other     uterine cancer  . Depression Other   . Hyperlipidemia Other   . Hypertension Other     History  Substance Use Topics  . Smoking status: Former Smoker -- 1.0 packs/day    Types: Cigarettes   Quit date: 06/25/2010  . Smokeless tobacco: Not on file  . Alcohol Use: Yes    OB History    Grav Para Term Preterm Abortions TAB SAB Ect Mult Living                  Review of Systems  Constitutional: Negative for fever.  HENT: Negative.   Respiratory: Positive for cough.   Cardiovascular: Positive for chest pain.  Gastrointestinal: Negative.  Negative for nausea and vomiting.    Allergies  Review of patient's allergies indicates no known allergies.  Home Medications   Current Outpatient Rx  Name Route Sig Dispense Refill  . ALBUTEROL SULFATE HFA 108 (90 BASE) MCG/ACT IN AERS Inhalation Inhale 2 puffs into the lungs 4 (four) times daily. 1 Inhaler 11  . ALBUTEROL SULFATE (2.5 MG/3ML) 0.083% IN NEBU Nebulization Take 3 mLs (2.5 mg total) by nebulization as directed. 75 mL 11  . LORATADINE 10 MG PO TABS Oral Take 10 mg by mouth daily.      . MULTI-VITAMIN/IRON PO TABS Oral Take 1 tablet by mouth 2 (two) times daily. Flintstones chew     . ZOLPIDEM TARTRATE ER 12.5 MG PO TBCR Oral Take 1 tablet (12.5 mg total) by mouth at bedtime as needed for  sleep. 30 tablet 5  . BECLOMETHASONE DIPROPIONATE 80 MCG/ACT IN AERS Inhalation Inhale 2 puffs into the lungs 2 (two) times daily. 1 Inhaler 11  . BIOTIN 1000 MCG PO TABS Oral Take 1,000 mcg by mouth daily.      Marland Kitchen BRIMONIDINE TARTRATE 0.1 % OP SOLN  as directed.      Marland Kitchen BRINZOLAMIDE 1 % OP SUSP  1 drop as directed.      Marland Kitchen CALCIUM-VITAMIN D-VITAMIN K 500-200-40 MG-UNT-MCG PO CHEW Oral Chew by mouth daily.      Marland Kitchen CEFDINIR 300 MG PO CAPS Oral Take 1 capsule (300 mg total) by mouth 2 (two) times daily. 20 capsule 0  . HYDROCOD POLST-CPM POLST ER 10-8 MG/5ML PO LQCR Oral Take 5 mLs by mouth every 12 (twelve) hours. 115 mL 0  . CYCLOBENZAPRINE HCL 10 MG PO TABS Oral Take 10 mg by mouth as needed.      . TYLENOL PM EXTRA STRENGTH PO Oral Take by mouth.      Marland Kitchen FLUTICASONE-SALMETEROL 250-50 MCG/DOSE IN AEPB Inhalation Inhale 1 puff into the lungs 2  (two) times daily. 1 puff and rise, twice daily     . MONTELUKAST SODIUM 10 MG PO TABS Oral Take 10 mg by mouth at bedtime.      Marland Kitchen PSEUDOEPH-CHLORPHEN-HYDROCOD 60-4-5 MG/5ML PO SOLN Oral Take 5 mLs by mouth 4 (four) times daily as needed. 240 mL 1  . TRAMADOL HCL 50 MG PO TABS Oral Take 50 mg by mouth every 6 (six) hours as needed.      . TRAVOPROST 0.004 % OP SOLN Both Eyes Place 1 drop into both eyes at bedtime.      Marland Kitchen VARENICLINE TARTRATE 1 MG PO TABS Oral Take 1 mg by mouth as directed.      Marland Kitchen VITAMIN B-12 1000 MCG PO TABS Oral Take 1,000 mcg by mouth daily.        BP 128/77  Pulse 66  Temp(Src) 98.4 F (36.9 C) (Oral)  Resp 18  SpO2 100%  Physical Exam  Nursing note and vitals reviewed. Constitutional: She is oriented to person, place, and time. She appears well-developed and well-nourished.  Neck: Normal range of motion. Neck supple.  Cardiovascular: Normal rate, normal heart sounds and intact distal pulses.   Pulmonary/Chest: Effort normal and breath sounds normal. She exhibits tenderness.  Neurological: She is alert and oriented to person, place, and time.  Skin: Skin is warm and dry.    ED Course  Procedures (including critical care time)  Labs Reviewed - No data to display No results found.   1. Musculoskeletal chest pain   2. Bronchitis, acute       MDM          Barkley Bruns, MD 06/25/11 [phone]

## 2011-08-29 ENCOUNTER — Other Ambulatory Visit: Payer: Self-pay | Admitting: Internal Medicine

## 2011-09-20 ENCOUNTER — Other Ambulatory Visit (HOSPITAL_BASED_OUTPATIENT_CLINIC_OR_DEPARTMENT_OTHER): Payer: Self-pay | Admitting: Physical Medicine and Rehabilitation

## 2011-09-20 DIAGNOSIS — M545 Low back pain, unspecified: Secondary | ICD-10-CM

## 2011-09-24 ENCOUNTER — Ambulatory Visit (HOSPITAL_BASED_OUTPATIENT_CLINIC_OR_DEPARTMENT_OTHER)
Admission: RE | Admit: 2011-09-24 | Discharge: 2011-09-24 | Disposition: A | Discharge status: Home / Self Care | Payer: 59 | Source: Ambulatory Visit | Attending: Physical Medicine and Rehabilitation | Admitting: Physical Medicine and Rehabilitation

## 2011-09-24 DIAGNOSIS — R209 Unspecified disturbances of skin sensation: Secondary | ICD-10-CM

## 2011-09-24 DIAGNOSIS — M545 Low back pain, unspecified: Secondary | ICD-10-CM

## 2011-09-24 DIAGNOSIS — M79609 Pain in unspecified limb: Secondary | ICD-10-CM

## 2011-09-24 DIAGNOSIS — M549 Dorsalgia, unspecified: Secondary | ICD-10-CM

## 2011-09-24 DIAGNOSIS — M5137 Other intervertebral disc degeneration, lumbosacral region: Secondary | ICD-10-CM | POA: Insufficient documentation

## 2011-09-24 DIAGNOSIS — M51379 Other intervertebral disc degeneration, lumbosacral region without mention of lumbar back pain or lower extremity pain: Secondary | ICD-10-CM | POA: Insufficient documentation

## 2011-09-24 DIAGNOSIS — M25559 Pain in unspecified hip: Secondary | ICD-10-CM

## 2012-01-15 ENCOUNTER — Encounter (HOSPITAL_COMMUNITY): Payer: Self-pay | Admitting: Emergency Medicine

## 2012-01-15 ENCOUNTER — Emergency Department (HOSPITAL_COMMUNITY)
Admission: EM | Admit: 2012-01-15 | Discharge: 2012-01-15 | Disposition: A | Discharge status: Home / Self Care | Payer: 59 | Source: Home / Self Care | Attending: Emergency Medicine | Admitting: Emergency Medicine

## 2012-01-15 DIAGNOSIS — J45901 Unspecified asthma with (acute) exacerbation: Secondary | ICD-10-CM

## 2012-01-15 MED ORDER — PREDNISONE 5 MG PO KIT: 1.0000 | PACK | Freq: Every day | ORAL | Status: DC

## 2012-01-15 MED ORDER — METHYLPREDNISOLONE SODIUM SUCC 125 MG IJ SOLR
125.0000 mg | Freq: Once | INTRAMUSCULAR | Status: AC
  Administered 2012-01-15: 125 mg via INTRAMUSCULAR

## 2012-01-15 MED ORDER — HYDROCOD POLST-CHLORPHEN POLST 10-8 MG/5ML PO LQCR: 5.0000 mL | Freq: Two times a day (BID) | ORAL | Status: AC | PRN

## 2012-01-15 MED ORDER — AZITHROMYCIN 250 MG PO TABS: ORAL_TABLET | ORAL | Status: AC

## 2012-01-15 MED ORDER — METHYLPREDNISOLONE SODIUM SUCC 125 MG IJ SOLR
INTRAMUSCULAR | Status: AC
  Filled 2012-01-15: qty 2

## 2012-01-15 MED ORDER — ALBUTEROL SULFATE (5 MG/ML) 0.5% IN NEBU
5.0000 mg | INHALATION_SOLUTION | Freq: Once | RESPIRATORY_TRACT | Status: AC
  Administered 2012-01-15: 5 mg via RESPIRATORY_TRACT

## 2012-01-15 MED ORDER — IPRATROPIUM BROMIDE 0.02 % IN SOLN
0.5000 mg | Freq: Once | RESPIRATORY_TRACT | Status: AC
  Administered 2012-01-15: 0.5 mg via RESPIRATORY_TRACT

## 2012-01-15 MED ORDER — ALBUTEROL SULFATE (5 MG/ML) 0.5% IN NEBU
INHALATION_SOLUTION | RESPIRATORY_TRACT | Status: AC
  Filled 2012-01-15: qty 1
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
## 2012-01-15 NOTE — ED Notes (Deleted)
Pt having upper respiratory congestion, nasal drainage and cough since yesterday getting progressively worse. No c/o pain, nausea or vomiting.
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
## 2012-04-01 ENCOUNTER — Encounter: Payer: Self-pay | Admitting: Cardiology

## 2012-06-02 ENCOUNTER — Telehealth (INDEPENDENT_AMBULATORY_CARE_PROVIDER_SITE_OTHER): Payer: Self-pay | Admitting: Surgery

## 2012-06-02 NOTE — Telephone Encounter (Signed)
06/02/12 left message and mailed recall letter for bariatric surgery follow up with Dr. Daphine Deutscher. Pt had RNY 12/08/07. (lss)

## 2013-05-02 ENCOUNTER — Emergency Department (HOSPITAL_BASED_OUTPATIENT_CLINIC_OR_DEPARTMENT_OTHER)
Admission: EM | Admit: 2013-05-02 | Discharge: 2013-05-02 | Disposition: A | Discharge status: Home / Self Care | Payer: Managed Care, Other (non HMO) | Attending: Emergency Medicine | Admitting: Emergency Medicine

## 2013-05-02 ENCOUNTER — Encounter (HOSPITAL_BASED_OUTPATIENT_CLINIC_OR_DEPARTMENT_OTHER): Payer: Self-pay | Admitting: Emergency Medicine

## 2013-05-02 DIAGNOSIS — G4733 Obstructive sleep apnea (adult) (pediatric): Secondary | ICD-10-CM | POA: Insufficient documentation

## 2013-05-02 DIAGNOSIS — Z87891 Personal history of nicotine dependence: Secondary | ICD-10-CM | POA: Insufficient documentation

## 2013-05-02 DIAGNOSIS — Z79899 Other long term (current) drug therapy: Secondary | ICD-10-CM | POA: Insufficient documentation

## 2013-05-02 DIAGNOSIS — G4730 Sleep apnea, unspecified: Secondary | ICD-10-CM | POA: Insufficient documentation

## 2013-05-02 DIAGNOSIS — J4489 Other specified chronic obstructive pulmonary disease: Secondary | ICD-10-CM | POA: Insufficient documentation

## 2013-05-02 DIAGNOSIS — I8002 Phlebitis and thrombophlebitis of superficial vessels of left lower extremity: Secondary | ICD-10-CM

## 2013-05-02 DIAGNOSIS — Z9884 Bariatric surgery status: Secondary | ICD-10-CM | POA: Insufficient documentation

## 2013-05-02 DIAGNOSIS — IMO0001 Reserved for inherently not codable concepts without codable children: Secondary | ICD-10-CM | POA: Insufficient documentation

## 2013-05-02 DIAGNOSIS — Z8701 Personal history of pneumonia (recurrent): Secondary | ICD-10-CM | POA: Insufficient documentation

## 2013-05-02 DIAGNOSIS — R609 Edema, unspecified: Secondary | ICD-10-CM | POA: Insufficient documentation

## 2013-05-02 DIAGNOSIS — I8000 Phlebitis and thrombophlebitis of superficial vessels of unspecified lower extremity: Secondary | ICD-10-CM | POA: Insufficient documentation

## 2013-05-02 DIAGNOSIS — J449 Chronic obstructive pulmonary disease, unspecified: Secondary | ICD-10-CM | POA: Insufficient documentation

## 2013-05-02 DIAGNOSIS — IMO0002 Reserved for concepts with insufficient information to code with codable children: Secondary | ICD-10-CM | POA: Insufficient documentation

## 2013-05-02 DIAGNOSIS — Q268 Other congenital malformations of great veins: Secondary | ICD-10-CM | POA: Insufficient documentation

## 2013-05-02 DIAGNOSIS — K219 Gastro-esophageal reflux disease without esophagitis: Secondary | ICD-10-CM | POA: Insufficient documentation

## 2013-05-02 LAB — CBC WITH DIFFERENTIAL/PLATELET
Basophils Relative: 0 % (ref 0–1)
HCT: 40.4 % (ref 36.0–46.0)
Hemoglobin: 13.5 g/dL (ref 12.0–15.0)
MCHC: 33.4 g/dL (ref 30.0–36.0)
MCV: 95.7 fL (ref 78.0–100.0)
Monocytes Absolute: 0.7 10*3/uL (ref 0.1–1.0)
Monocytes Relative: 7 % (ref 3–12)
Neutro Abs: 6 10*3/uL (ref 1.7–7.7)

## 2013-05-02 LAB — BASIC METABOLIC PANEL WITH GFR
BUN: 22 mg/dL (ref 6–23)
CO2: 26 meq/L (ref 19–32)
Calcium: 9.1 mg/dL (ref 8.4–10.5)
Chloride: 102 meq/L (ref 96–112)
Creatinine, Ser: 1.3 mg/dL — ABNORMAL HIGH (ref 0.50–1.10)
GFR calc Af Amer: 60 mL/min — ABNORMAL LOW (ref >90)
GFR calc non Af Amer: 51 mL/min — ABNORMAL LOW (ref >90)
Glucose, Bld: 105 mg/dL — ABNORMAL HIGH (ref 70–99)
Potassium: 3.8 meq/L (ref 3.5–5.1)
Sodium: 138 meq/L (ref 135–145)

## 2013-05-02 LAB — D-DIMER, QUANTITATIVE: D-Dimer, Quant: 0.44 ug{FEU}/mL (ref 0.00–0.48)

## 2013-05-02 NOTE — ED Provider Notes (Signed)
CSN: [id number]     Arrival date & time 05/02/13  2045 History   First MD Initiated Contact with Patient 05/02/13 2138     Chief Complaint  Patient presents with  . Leg Pain   (Consider location/radiation/quality/duration/timing/severity/associated sxs/prior Treatment) Patient is a 37 y.o. female presenting with leg pain. The history is provided by the patient. No language interpreter was used.  Leg Pain Location:  Leg Time since incident:  2 days Injury: no   Leg location:  L lower leg Pain details:    Quality:  Aching   Radiates to:  Does not radiate   Severity:  Moderate   Onset quality:  Gradual   Duration:  2 days   Timing:  Constant   Progression:  Unchanged Chronicity:  New Dislocation: no   Foreign body present:  No foreign bodies Tetanus status:  Unknown Prior injury to area:  No Relieved by:  Nothing Exacerbated by: palpation. Ineffective treatments:  None tried Associated symptoms: no fatigue, no fever and no neck pain   Risk factors comment:  Varicose veins   Past Medical History  Diagnosis Date  . GERD (gastroesophageal reflux disease)   . Edema   . Obstructive sleep apnea (adult) (pediatric)   . Allergy   . Glaucoma   . Scimitar syndrome   . Obesity, unspecified   . Tobacco use disorder   . Family history of psychiatric condition   . Family history of ischemic heart disease   . Sleep apnea     NPSG 09/09/07 AHI 8.1/hr prolong desat  . COPD (chronic obstructive pulmonary disease)     PFT 07/22/08-FEV1/FVC 0.49  . Pneumonia     hosp 2002  . Degenerative disc disease    Past Surgical History  Procedure Laterality Date  . Abdominal hysterectomy      partial- oophorectomy 1990  . Gastric bypass  6/09  . Eye surgery     Family History  Problem Relation Age of Onset  . Cancer Maternal Grandfather     lung cancer  . Diabetes Paternal Grandmother     type 2  . COPD Other   . Asthma Other   . Heart disease Other     CAD  . Cancer Other    uterine cancer  . Depression Other   . Hyperlipidemia Other   . Hypertension Other    History  Substance Use Topics  . Smoking status: Former Smoker -- 1.00 packs/day    Types: Cigarettes    Quit date: 06/25/2010  . Smokeless tobacco: Not on file  . Alcohol Use: Yes   OB History   Grav Para Term Preterm Abortions TAB SAB Ect Mult Living                 Review of Systems  Constitutional: Negative for fever, chills and fatigue.  HENT: Negative for trouble swallowing.   Eyes: Negative for visual disturbance.  Respiratory: Negative for shortness of breath.   Cardiovascular: Negative for chest pain and palpitations.  Gastrointestinal: Negative for nausea, vomiting, abdominal pain and diarrhea.  Genitourinary: Negative for dysuria and difficulty urinating.  Musculoskeletal: Positive for myalgias. Negative for arthralgias and neck pain.  Skin: Negative for color change.  Neurological: Negative for dizziness and weakness.  Psychiatric/Behavioral: Negative for dysphoric mood.    Allergies  Review of patient's allergies indicates no known allergies.  Home Medications   Current Outpatient Rx  Name  Route  Sig  Dispense  Refill  . albuterol (PROAIR HFA)  108 (90 BASE) MCG/ACT inhaler   Inhalation   Inhale 2 puffs into the lungs 4 (four) times daily.   1 Inhaler   11   . albuterol (PROVENTIL) (2.5 MG/3ML) 0.083% nebulizer solution   Nebulization   Take 3 mLs (2.5 mg total) by nebulization as directed.   75 mL   11   . loratadine (CLARITIN) 10 MG tablet   Oral   Take 10 mg by mouth daily.           . beclomethasone (QVAR) 80 MCG/ACT inhaler   Inhalation   Inhale 2 puffs into the lungs 2 (two) times daily.   1 Inhaler   11   . Biotin 1000 MCG tablet   Oral   Take 1,000 mcg by mouth daily.           . brimonidine (ALPHAGAN P) 0.1 % SOLN      as directed.           . brinzolamide (AZOPT) 1 % ophthalmic suspension      1 drop as directed.           .  Calcium-Vitamin D-Vitamin K ([employer] CALCIUM CHEWS) 500-200-40 MG-UNT-MCG CHEW   Oral   Chew by mouth daily.           . chlorpheniramine-HYDROcodone (TUSSIONEX PENNKINETIC ER) 10-8 MG/5ML LQCR   Oral   Take 5 mLs by mouth every 12 (twelve) hours.   115 mL   0   . chlorpheniramine-HYDROcodone (TUSSIONEX) 10-8 MG/5ML LQCR   Oral   Take 5 mLs by mouth every 12 (twelve) hours as needed.   140 mL   0   . cyclobenzaprine (FLEXERIL) 10 MG tablet   Oral   Take 10 mg by mouth as needed.           . Diphenhydramine-APAP, sleep, (TYLENOL PM EXTRA STRENGTH PO)   Oral   Take by mouth.           . Fluticasone-Salmeterol (ADVAIR DISKUS) 250-50 MCG/DOSE AEPB   Inhalation   Inhale 1 puff into the lungs 2 (two) times daily. 1 puff and rise, twice daily          . montelukast (SINGULAIR) 10 MG tablet   Oral   Take 10 mg by mouth at bedtime.           . Multiple Vitamins-Iron (MULTI-VITAMIN/IRON) TABS   Oral   Take 1 tablet by mouth 2 (two) times daily. Flintstones chew          . PredniSONE 5 MG KIT   Oral   Take 1 kit (5 mg total) by mouth daily after breakfast. Prednisone 5 mg 6 day dosepack.  Take as directed.   1 kit   0   . Pseudoeph-Chlorphen-Hydrocod (ZUTRIPRO) 60-4-5 MG/5ML SOLN   Oral   Take 5 mLs by mouth 4 (four) times daily as needed.   240 mL   1   . traMADol (ULTRAM) 50 MG tablet   Oral   Take 50 mg by mouth every 6 (six) hours as needed.           . travoprost, benzalkonium, (TRAVATAN Z) 0.004 % ophthalmic solution   Both Eyes   Place 1 drop into both eyes at bedtime.           . varenicline (CHANTIX CONTINUING MONTH PAK) 1 MG tablet   Oral   Take 1 mg by mouth as directed.           Marland Kitchen  vitamin B-12 (CYANOCOBALAMIN) 1000 MCG tablet   Oral   Take 1,000 mcg by mouth daily.           Marland Kitchen EXPIRED: zolpidem (AMBIEN CR) 12.5 MG CR tablet   Oral   Take 1 tablet (12.5 mg total) by mouth at bedtime as needed for sleep.   30 tablet   5    BP  135/84  Pulse 70  Temp(Src) 98.3 F (36.8 C) (Oral)  Resp 16  Wt 260 lb (117.935 kg)  SpO2 98% Physical Exam  Nursing note and vitals reviewed. Constitutional: She is oriented to person, place, and time. She appears well-developed and well-nourished. No distress.  HENT:  Head: Normocephalic and atraumatic.  Eyes: Conjunctivae are normal.  Cardiovascular: Normal rate and regular rhythm.  Exam reveals no gallop and no friction rub.   No murmur heard. Pulmonary/Chest: Effort normal and breath sounds normal. She has no wheezes. She has no rales. She exhibits no tenderness.  Abdominal: Soft. There is no tenderness.  Musculoskeletal: Normal range of motion.  Neurological: She is alert and oriented to person, place, and time. Coordination normal.  Speech is goal-oriented. Moves limbs without ataxia.   Skin: Skin is warm and dry.  Left distal medial leg erythema in a 3x3cm area with varicose vein and warmth to the touch. Area is tender to palpation.   Psychiatric: She has a normal mood and affect. Her behavior is normal.    ED Course  Procedures (including critical care time) Labs Review Labs Reviewed  BASIC METABOLIC PANEL - Abnormal; Notable for the following:    Glucose, Bld 105 (*)    Creatinine, Ser 1.30 (*)    GFR calc non Af Amer 51 (*)    GFR calc Af Amer 60 (*)    All other components within normal limits  CBC WITH DIFFERENTIAL  D-DIMER, QUANTITATIVE   Imaging Review No results found.  EKG Interpretation   None       MDM   1. Thrombophlebitis leg superficial, left     11:03 PM Labs unremarkable. D-dimer negative. Patient likely has superficial thrombophlebitis. Patient will be advised to take ibuprofen and elevate leg. Vitals stable and patient afebrile.    Emilia Beck, PA-C 05/02/13 2310
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
## 2013-05-02 NOTE — ED Notes (Signed)
Assumed care of patient from Sylvia, RN. 

## 2013-05-03 NOTE — ED Provider Notes (Signed)
Medical screening examination/treatment/procedure(s) were performed by non-physician practitioner and as supervising physician I was immediately available for consultation/collaboration.  EKG Interpretation   None         Ladarion Munyon N Flor Houdeshell, DO 05/03/13 1227 

## 2015-11-14 ENCOUNTER — Encounter (HOSPITAL_COMMUNITY): Payer: Self-pay | Admitting: Emergency Medicine

## 2015-11-14 ENCOUNTER — Emergency Department (HOSPITAL_COMMUNITY): Payer: BLUE CROSS/BLUE SHIELD

## 2015-11-14 ENCOUNTER — Emergency Department (HOSPITAL_COMMUNITY)
Admission: EM | Admit: 2015-11-14 | Discharge: 2015-11-14 | Disposition: A | Discharge status: Home / Self Care | Payer: BLUE CROSS/BLUE SHIELD | Attending: Emergency Medicine | Admitting: Emergency Medicine

## 2015-11-14 DIAGNOSIS — J449 Chronic obstructive pulmonary disease, unspecified: Secondary | ICD-10-CM | POA: Diagnosis not present

## 2015-11-14 DIAGNOSIS — Y939 Activity, unspecified: Secondary | ICD-10-CM | POA: Diagnosis not present

## 2015-11-14 DIAGNOSIS — Y999 Unspecified external cause status: Secondary | ICD-10-CM | POA: Insufficient documentation

## 2015-11-14 DIAGNOSIS — Z87891 Personal history of nicotine dependence: Secondary | ICD-10-CM | POA: Insufficient documentation

## 2015-11-14 DIAGNOSIS — M1990 Unspecified osteoarthritis, unspecified site: Secondary | ICD-10-CM | POA: Insufficient documentation

## 2015-11-14 DIAGNOSIS — S42202A Unspecified fracture of upper end of left humerus, initial encounter for closed fracture: Secondary | ICD-10-CM | POA: Diagnosis not present

## 2015-11-14 DIAGNOSIS — Y929 Unspecified place or not applicable: Secondary | ICD-10-CM | POA: Diagnosis not present

## 2015-11-14 DIAGNOSIS — W1809XA Striking against other object with subsequent fall, initial encounter: Secondary | ICD-10-CM | POA: Diagnosis not present

## 2015-11-14 DIAGNOSIS — S4992XA Unspecified injury of left shoulder and upper arm, initial encounter: Secondary | ICD-10-CM | POA: Diagnosis present

## 2015-11-14 DIAGNOSIS — Z79899 Other long term (current) drug therapy: Secondary | ICD-10-CM | POA: Diagnosis not present

## 2015-11-14 DIAGNOSIS — S42212A Unspecified displaced fracture of surgical neck of left humerus, initial encounter for closed fracture: Secondary | ICD-10-CM

## 2015-11-14 MED ORDER — ONDANSETRON HCL 4 MG PO TABS: 4.0000 mg | ORAL_TABLET | Freq: Three times a day (TID) | ORAL | Status: AC | PRN

## 2015-11-14 MED ORDER — OXYCODONE-ACETAMINOPHEN 5-325 MG PO TABS: 1.0000 | ORAL_TABLET | Freq: Four times a day (QID) | ORAL | Status: AC | PRN

## 2015-11-14 MED ORDER — ONDANSETRON HCL 4 MG/2ML IJ SOLN
4.0000 mg | Freq: Once | INTRAMUSCULAR | Status: AC
  Administered 2015-11-14: 4 mg via INTRAVENOUS
  Filled 2015-11-14: qty 2

## 2015-11-14 MED ORDER — HYDROMORPHONE HCL 1 MG/ML IJ SOLN
1.0000 mg | Freq: Once | INTRAMUSCULAR | Status: AC
  Administered 2015-11-14: 1 mg via INTRAVENOUS
  Filled 2015-11-14: qty 1

## 2015-11-14 NOTE — ED Provider Notes (Signed)
CSN: [id number]     Arrival date & time 11/14/15  2145 History  By signing my name below, I, Georgette Shell, attest that this documentation has been prepared under the direction and in the presence of Domenic Moras, PA-C. Electronically Signed: Georgette Shell, ED Scribe. 11/14/2015. 10:24 PM.   Chief Complaint  Patient presents with  . Shoulder Injury   The history is provided by the patient. No language interpreter was used.   HPI Comments: Anna [name] is a 40 y.o. female who presents to the Emergency Department complaining of 10/10, constant left shoulder pain s/p mechanical injury today. Pt states she had been drinking 3-4 beers and went to the toilet to vomit and fell into the bathtub. No LOC. Pt denies any head injury. No prior injury to this shoulder before. Pt denies numbness and paresthesia. Pt denies any additional injuries.   Past Medical History  Diagnosis Date  . GERD (gastroesophageal reflux disease)   . Edema   . Obstructive sleep apnea (adult) (pediatric)   . Allergy   . Glaucoma   . Scimitar syndrome   . Obesity, unspecified   . Tobacco use disorder   . Family history of psychiatric condition   . Family history of ischemic heart disease   . Sleep apnea     NPSG 09/09/07 AHI 8.1/hr prolong desat  . COPD (chronic obstructive pulmonary disease) ([place])     PFT 07/22/08-FEV1/FVC 0.49  . Pneumonia     hosp 2002  . Degenerative disc disease    Past Surgical History  Procedure Laterality Date  . Abdominal hysterectomy      partial- oophorectomy 1990  . Gastric bypass  6/09  . Eye surgery     Family History  Problem Relation Age of Onset  . Cancer Maternal Grandfather     lung cancer  . Diabetes Paternal Grandmother     type 2  . COPD Other   . Asthma Other   . Heart disease Other     CAD  . Cancer Other     uterine cancer  . Depression Other   . Hyperlipidemia Other   . Hypertension Other    Social History  Substance Use Topics  . Smoking status: Former Smoker --  1.00 packs/day    Types: Cigarettes    Quit date: 06/25/2010  . Smokeless tobacco: None  . Alcohol Use: Yes   OB History    No data available     Review of Systems  Constitutional: Negative for fever.  Gastrointestinal: Negative for vomiting.  Neurological: Negative for syncope.  All other systems reviewed and are negative.     Allergies  Review of patient's allergies indicates no known allergies.  Home Medications   Prior to Admission medications   Medication Sig Start Date End Date Taking? Authorizing Provider  albuterol (PROAIR HFA) 108 (90 BASE) MCG/ACT inhaler Inhale 2 puffs into the lungs 4 (four) times daily. 02/14/11   Janith Lima, MD  albuterol (PROVENTIL) (2.5 MG/3ML) 0.083% nebulizer solution Take 3 mLs (2.5 mg total) by nebulization as directed. 02/14/11   Janith Lima, MD  beclomethasone (QVAR) 80 MCG/ACT inhaler Inhale 2 puffs into the lungs 2 (two) times daily. 02/14/11   Janith Lima, MD  Biotin 1000 MCG tablet Take 1,000 mcg by mouth daily.      Historical Provider, MD  brimonidine (ALPHAGAN P) 0.1 % SOLN as directed.      Historical Provider, MD  brinzolamide (AZOPT) 1 % ophthalmic  suspension 1 drop as directed.      Historical Provider, MD  Calcium-Vitamin D-Vitamin K ([employer] CALCIUM CHEWS) 500-200-40 MG-UNT-MCG CHEW Chew by mouth daily.      Historical Provider, MD  chlorpheniramine-HYDROcodone (TUSSIONEX PENNKINETIC ER) 10-8 MG/5ML LQCR Take 5 mLs by mouth every 12 (twelve) hours. 06/25/11   Billy Fischer, MD  chlorpheniramine-HYDROcodone (TUSSIONEX) 10-8 MG/5ML LQCR Take 5 mLs by mouth every 12 (twelve) hours as needed. 01/15/12   Harden Mo, MD  cyclobenzaprine (FLEXERIL) 10 MG tablet Take 10 mg by mouth as needed.      Historical Provider, MD  Diphenhydramine-APAP, sleep, (TYLENOL PM EXTRA STRENGTH PO) Take by mouth.      Historical Provider, MD  Fluticasone-Salmeterol (ADVAIR DISKUS) 250-50 MCG/DOSE AEPB Inhale 1 puff into the lungs 2 (two) times daily.  1 puff and rise, twice daily     Historical Provider, MD  loratadine (CLARITIN) 10 MG tablet Take 10 mg by mouth daily.      Historical Provider, MD  montelukast (SINGULAIR) 10 MG tablet Take 10 mg by mouth at bedtime.      Historical Provider, MD  Multiple Vitamins-Iron (MULTI-VITAMIN/IRON) TABS Take 1 tablet by mouth 2 (two) times daily. Flintstones chew     Historical Provider, MD  PredniSONE 5 MG KIT Take 1 kit (5 mg total) by mouth daily after breakfast. Prednisone 5 mg 6 day dosepack.  Take as directed. 01/15/12   Harden Mo, MD  Pseudoeph-Chlorphen-Hydrocod (ZUTRIPRO) 60-4-5 MG/5ML SOLN Take 5 mLs by mouth 4 (four) times daily as needed. 02/14/11   Janith Lima, MD  traMADol (ULTRAM) 50 MG tablet Take 50 mg by mouth every 6 (six) hours as needed.      Historical Provider, MD  travoprost, benzalkonium, (TRAVATAN Z) 0.004 % ophthalmic solution Place 1 drop into both eyes at bedtime.      Historical Provider, MD  varenicline (CHANTIX CONTINUING MONTH PAK) 1 MG tablet Take 1 mg by mouth as directed.      Historical Provider, MD  vitamin B-12 (CYANOCOBALAMIN) 1000 MCG tablet Take 1,000 mcg by mouth daily.      Historical Provider, MD  zolpidem (AMBIEN CR) 12.5 MG CR tablet Take 1 tablet (12.5 mg total) by mouth at bedtime as needed for sleep. 02/14/11 02/14/12  Janith Lima, MD   BP 130/92 mmHg  Pulse 76  Temp(Src) 98.3 F (36.8 C) (Oral)  Resp 20  Ht 5' 8" (1.727 m)  Wt 215 lb (97.523 kg)  BMI 32.70 kg/m2  SpO2 98% Physical Exam  Constitutional: She appears well-developed and well-nourished.  HENT:  Head: Normocephalic.  Eyes: Conjunctivae are normal.  Cardiovascular: Normal rate and regular rhythm.   2+ radial pulse.   Pulmonary/Chest: Effort normal. No respiratory distress.  Abdominal: She exhibits no distension.  Musculoskeletal: Normal range of motion.  Tenderness noted to left shoulder at the glenol humeral region with crepitus. Potential deformity noted. Decreased ROM to  left shoulder secondary to pain. Left elbow nontender with full ROM. Left wrist is nontender with ROM  Neurological: She is alert.  Skin: Skin is warm and dry.  Psychiatric: She has a normal mood and affect. Her behavior is normal.  Nursing note and vitals reviewed.   ED Course  Procedures  DIAGNOSTIC STUDIES: Oxygen Saturation is 98% on RA, normal by my interpretation.    COORDINATION OF CARE: 10:23 PM Discussed treatment plan with pt at bedside which includes Rx of pain medication and x-ray and pt agreed  to plan.  Imaging Review Dg Shoulder Left  11/14/2015  CLINICAL DATA:  Left proximal humerus and shoulder pain after injury. Fall while in the bath tub tonight. EXAM: LEFT SHOULDER - 2+ VIEW COMPARISON:  None. FINDINGS: Comminuted displaced fracture of the surgical neck of the left humerus. Displacement of less than 1 cm. No extension to the glenohumeral joint. Acromioclavicular joint alignment is maintained. IMPRESSION: Comminuted and displaced left proximal humerus fracture. No intra-articular extension. Electronically Signed   By: Jeb Levering M.D.   On: 11/14/2015 22:28   Dg Humerus Left  11/14/2015  CLINICAL DATA:  Left proximal humerus and shoulder pain after injury. Fall while in the bath tub tonight. EXAM: LEFT HUMERUS - 2+ VIEW COMPARISON:  None. FINDINGS: Comminuted and displaced fracture of the surgical neck of the left humerus. Displacement is less than 1 cm. No extension to the glenohumeral joint. The distal humerus is intact. IMPRESSION: Comminuted and displaced left proximal humerus fracture. Electronically Signed   By: Jeb Levering M.D.   On: 11/14/2015 22:28   I have personally reviewed and evaluated these images as part of my medical decision-making.    MDM   Final diagnoses:  Fracture, humerus, neck, left, closed, initial encounter    BP 134/82 mmHg  Pulse 83  Temp(Src) 98.1 F (36.7 C) (Oral)  Resp 15  Ht 5' 8" (1.727 m)  Wt 97.523 kg  BMI 32.70  kg/m2  SpO2 94%   I personally performed the services described in this documentation, which was scribed in my presence. The recorded information has been reviewed and is accurate.   11:12 PM Patient fell striking her left shoulder against the bathtub. The x-ray demonstrate comminuted and displaced left proximal humerus fracture. This is a closed injury. Displacement is less than 1 cm. She is neurovascularly intact. No other injury noted. Sling provided as well as pain medication given. She admits to drinking alcohol but currently clinically sober. She is able to ambulate. She has follow-up with his orthopedic in the past. Referral given.  Care discussed with Dr. Winfred Leeds.    Domenic Moras, PA-C 11/14/15 2313  Orlie Dakin, MD 11/14/15 2322

## 2015-11-14 NOTE — Discharge Instructions (Signed)
Please follow up with orthopedist this week for further management of your broken left shoulder.  Wear sling as provided.  Take pain medication as needed but avoid operating heavy machinery or driving as the medication can make you drowsy.    Shoulder Fracture You have a fractured humerus (bone in the upper arm) at the shoulder just below the ball of the shoulder joint. Most of the time the bones of a broken shoulder are in an acceptable position. Usually the injury can be treated with a shoulder immobilizer or sling and swath bandage. These devices support the arm and prevent any shoulder movement. If the bones are not in a good position, then surgery is sometimes needed. Shoulder fractures usually cause swelling, pain, and discoloration around the upper arm initially. They heal in 8-12 weeks with proper treatment. Rest in bed or a reclining chair as long as your shoulder is very painful. Sitting up generally results in less pain at the fracture site. Do not remove your shoulder bandage until your caregiver approves. You may apply ice packs over the shoulder for 20-30 minutes every 2 hours for the next 2-3 days to reduce the pain and swelling. Use your pain medicine as prescribed.  SEEK IMMEDIATE MEDICAL CARE IF:  You develop severe shoulder pain unrelieved by rest and taking pain medicine.  You have pain, numbness, tingling, or weakness in the hand or wrist.  You develop shortness of breath, chest pain, severe weakness, or fainting.  You have severe pain with motion of the fingers or wrist. MAKE SURE YOU:   Understand these instructions.  Will watch your condition.  Will get help right away if you are not doing well or get worse.   This information is not intended to replace advice given to you by your health care provider. Make sure you discuss any questions you have with your health care provider.   Document Released: 06/06/2004 Document Revised: 07/22/2011 Document Reviewed:  08/17/2008 Elsevier Interactive Patient Education [employer]2016 Elsevier Inc.

## 2015-11-14 NOTE — ED Provider Notes (Signed)
Patient fell injuring her left shoulder approximately 2 hours ago. No other injury. No treatment prior to coming here.. On exam alert Glasgow Coma Score 15. Appears mildly uncomfortable left upper extremity no deformity. Tender over left shoulder. Radial pulse 2+. Skin intact.  Doug SouSam Aurianna Earlywine, MD 11/14/15 2322

## 2015-11-14 NOTE — ED Notes (Signed)
Patient here with complaints of left should der injury today. Fall Pain 10/10. Deformity noted.

## 2015-11-18 ENCOUNTER — Encounter (HOSPITAL_COMMUNITY): Payer: Self-pay

## 2015-11-18 ENCOUNTER — Emergency Department (HOSPITAL_COMMUNITY)
Admission: EM | Admit: 2015-11-18 | Discharge: 2015-11-18 | Disposition: A | Discharge status: Home / Self Care | Payer: BLUE CROSS/BLUE SHIELD | Attending: Emergency Medicine | Admitting: Emergency Medicine

## 2015-11-18 DIAGNOSIS — J449 Chronic obstructive pulmonary disease, unspecified: Secondary | ICD-10-CM | POA: Insufficient documentation

## 2015-11-18 DIAGNOSIS — Y999 Unspecified external cause status: Secondary | ICD-10-CM | POA: Insufficient documentation

## 2015-11-18 DIAGNOSIS — Y939 Activity, unspecified: Secondary | ICD-10-CM | POA: Insufficient documentation

## 2015-11-18 DIAGNOSIS — S40022A Contusion of left upper arm, initial encounter: Secondary | ICD-10-CM | POA: Diagnosis not present

## 2015-11-18 DIAGNOSIS — Y929 Unspecified place or not applicable: Secondary | ICD-10-CM | POA: Diagnosis not present

## 2015-11-18 DIAGNOSIS — X58XXXA Exposure to other specified factors, initial encounter: Secondary | ICD-10-CM | POA: Diagnosis not present

## 2015-11-18 DIAGNOSIS — S4992XA Unspecified injury of left shoulder and upper arm, initial encounter: Secondary | ICD-10-CM | POA: Diagnosis present

## 2015-11-18 DIAGNOSIS — Z87891 Personal history of nicotine dependence: Secondary | ICD-10-CM | POA: Insufficient documentation

## 2015-11-18 DIAGNOSIS — E669 Obesity, unspecified: Secondary | ICD-10-CM | POA: Insufficient documentation

## 2015-11-18 MED ORDER — HYDROCODONE-ACETAMINOPHEN 5-325 MG PO TABS: 1.0000 | ORAL_TABLET | ORAL | Status: AC | PRN

## 2015-11-18 NOTE — ED Notes (Addendum)
Patient states that was seen here on July 4 for shoulder injury.   Patient f/u with ortho on July 5 and was advised to keep in a sling for 4 weeks and f/u with them on July 12.  Patient states that notes there is increased [facility] and increased bruising.  Patient states that at times has some tingling in the left fingers and notes a burning sensation that radiates down the left arm that comes and goes.  Patient states that left hand appears to be lighter in color than the right.  On assessment patient has movement in digits and palpable radial pulse in left wrist.  Pain rates pain 4/10.

## 2015-11-18 NOTE — ED Provider Notes (Signed)
CSN: [id number]     Arrival date & time 11/18/15  0211 History   First MD Initiated Contact with Patient 11/18/15 0231     Chief Complaint  Patient presents with  . Shoulder Pain     (Consider location/radiation/quality/duration/timing/severity/associated sxs/prior Treatment) HPI Comments: Patient with a history of recent left humerus fracture (11/14/15) presents with concern for new swelling in distal upper left arm that started today. She noted that there is new bruising to the medial upper arm with some swelling and reports discomfort in that area. No new injury. She was also concerned because she feels her left hand is pale. No pain in the hand. She has had orthopedic follow up with Dr. Tamera Punt and will return on 11/22/15 for scheduled follow up.   Patient is a 40 y.o. female presenting with shoulder pain. The history is provided by the patient. No language interpreter was used.  Shoulder Pain Associated symptoms: no fever     Past Medical History  Diagnosis Date  . GERD (gastroesophageal reflux disease)   . Edema   . Obstructive sleep apnea (adult) (pediatric)   . Allergy   . Glaucoma   . Scimitar syndrome   . Obesity, unspecified   . Tobacco use disorder   . Family history of psychiatric condition   . Family history of ischemic heart disease   . Sleep apnea     NPSG 09/09/07 AHI 8.1/hr prolong desat  . COPD (chronic obstructive pulmonary disease) ([place])     PFT 07/22/08-FEV1/FVC 0.49  . Pneumonia     hosp 2002  . Degenerative disc disease    Past Surgical History  Procedure Laterality Date  . Abdominal hysterectomy      partial- oophorectomy 1990  . Gastric bypass  6/09  . Eye surgery     Family History  Problem Relation Age of Onset  . Cancer Maternal Grandfather     lung cancer  . Diabetes Paternal Grandmother     type 2  . COPD Other   . Asthma Other   . Heart disease Other     CAD  . Cancer Other     uterine cancer  . Depression Other   . Hyperlipidemia  Other   . Hypertension Other    Social History  Substance Use Topics  . Smoking status: Former Smoker -- 1.00 packs/day    Types: Cigarettes    Quit date: 06/25/2010  . Smokeless tobacco: None  . Alcohol Use: Yes   OB History    No data available     Review of Systems  Constitutional: Negative for fever and chills.  Respiratory: Negative.  Negative for shortness of breath.   Cardiovascular: Negative.  Negative for chest pain.  Musculoskeletal:       See HPI.  Skin: Positive for color change.  Neurological: Negative.  Negative for numbness.      Allergies  Review of patient's allergies indicates no known allergies.  Home Medications   Prior to Admission medications   Medication Sig Start Date End Date Taking? Authorizing Provider  albuterol (PROAIR HFA) 108 (90 BASE) MCG/ACT inhaler Inhale 2 puffs into the lungs 4 (four) times daily. 02/14/11   Janith Lima, MD  albuterol (PROVENTIL) (2.5 MG/3ML) 0.083% nebulizer solution Take 3 mLs (2.5 mg total) by nebulization as directed. 02/14/11   Janith Lima, MD  beclomethasone (QVAR) 80 MCG/ACT inhaler Inhale 2 puffs into the lungs 2 (two) times daily. 02/14/11   Janith Lima, MD  Biotin 1000 MCG tablet Take 1,000 mcg by mouth daily.      Historical Provider, MD  brimonidine (ALPHAGAN P) 0.1 % SOLN as directed.      Historical Provider, MD  brinzolamide (AZOPT) 1 % ophthalmic suspension 1 drop as directed.      Historical Provider, MD  Calcium-Vitamin D-Vitamin K ([employer] CALCIUM CHEWS) 500-200-40 MG-UNT-MCG CHEW Chew by mouth daily.      Historical Provider, MD  chlorpheniramine-HYDROcodone (TUSSIONEX PENNKINETIC ER) 10-8 MG/5ML LQCR Take 5 mLs by mouth every 12 (twelve) hours. 06/25/11   Billy Fischer, MD  chlorpheniramine-HYDROcodone (TUSSIONEX) 10-8 MG/5ML LQCR Take 5 mLs by mouth every 12 (twelve) hours as needed. 01/15/12   Harden Mo, MD  cyclobenzaprine (FLEXERIL) 10 MG tablet Take 10 mg by mouth as needed.      Historical  Provider, MD  Diphenhydramine-APAP, sleep, (TYLENOL PM EXTRA STRENGTH PO) Take by mouth.      Historical Provider, MD  Fluticasone-Salmeterol (ADVAIR DISKUS) 250-50 MCG/DOSE AEPB Inhale 1 puff into the lungs 2 (two) times daily. 1 puff and rise, twice daily     Historical Provider, MD  loratadine (CLARITIN) 10 MG tablet Take 10 mg by mouth daily.      Historical Provider, MD  montelukast (SINGULAIR) 10 MG tablet Take 10 mg by mouth at bedtime.      Historical Provider, MD  Multiple Vitamins-Iron (MULTI-VITAMIN/IRON) TABS Take 1 tablet by mouth 2 (two) times daily. Flintstones chew     Historical Provider, MD  ondansetron (ZOFRAN) 4 MG tablet Take 1 tablet (4 mg total) by mouth every 8 (eight) hours as needed for nausea or vomiting. 11/14/15   Domenic Moras, PA-C  oxyCODONE-acetaminophen (PERCOCET/ROXICET) 5-325 MG tablet Take 1 tablet by mouth every 6 (six) hours as needed for moderate pain or severe pain. 11/14/15   Domenic Moras, PA-C  PredniSONE 5 MG KIT Take 1 kit (5 mg total) by mouth daily after breakfast. Prednisone 5 mg 6 day dosepack.  Take as directed. 01/15/12   Harden Mo, MD  Pseudoeph-Chlorphen-Hydrocod (ZUTRIPRO) 60-4-5 MG/5ML SOLN Take 5 mLs by mouth 4 (four) times daily as needed. 02/14/11   Janith Lima, MD  traMADol (ULTRAM) 50 MG tablet Take 50 mg by mouth every 6 (six) hours as needed.      Historical Provider, MD  travoprost, benzalkonium, (TRAVATAN Z) 0.004 % ophthalmic solution Place 1 drop into both eyes at bedtime.      Historical Provider, MD  varenicline (CHANTIX CONTINUING MONTH PAK) 1 MG tablet Take 1 mg by mouth as directed.      Historical Provider, MD  vitamin B-12 (CYANOCOBALAMIN) 1000 MCG tablet Take 1,000 mcg by mouth daily.      Historical Provider, MD  zolpidem (AMBIEN CR) 12.5 MG CR tablet Take 1 tablet (12.5 mg total) by mouth at bedtime as needed for sleep. 02/14/11 02/14/12  Janith Lima, MD   BP 174/98 mmHg  Pulse 85  Temp(Src) 98.9 F (37.2 C) (Oral)  Resp 20   SpO2 96% Physical Exam  Constitutional: She is oriented to person, place, and time. She appears well-developed and well-nourished. No distress.  Pulmonary/Chest: Effort normal.  Musculoskeletal:  Left upper extremity has a large hematoma to distal medial upper arm. Area is soft. There is swelling over elbow without discoloration or induration. Distal pulses are 2+. Hands bilaterally without swelling and are of equal temperature. Cap RF <2s  Neurological: She is alert and oriented to person, place, and time.  Skin: Skin is warm and dry. There is erythema.    ED Course  Procedures (including critical care time) Labs Review Labs Reviewed - No data to display  Imaging Review No results found. I have personally reviewed and evaluated these images and lab results as part of my medical decision-making.   EKG Interpretation None      MDM   Final diagnoses:  None    1. Left upper arm hematoma  Patient with concern for new swelling and color change 4 days after acute humerus fracture of left arm. No vascular compromise. No evidence for infection. She is stable for discharge home with planned orthopedic follow up.    Charlann Lange, PA-C 11/18/15 0310  Charlesetta Shanks, MD 11/18/15 2329

## 2015-11-18 NOTE — ED Notes (Signed)
PA at bedside.

## 2015-11-18 NOTE — Discharge Instructions (Signed)
Hematoma  A hematoma is a collection of blood under the skin, in an organ, in a body space, in a joint space, or in other tissue. The blood can clot to form a lump that you can see and feel. The lump is often firm and may sometimes become sore and tender. Most hematomas get better in a few days to weeks. However, some hematomas may be serious and require medical care. Hematomas can range in size from very small to very large.  CAUSES   A hematoma can be caused by a blunt or penetrating injury. It can also be caused by spontaneous leakage from a blood vessel under the skin. Spontaneous leakage from a blood vessel is more likely to occur in older people, especially those taking blood thinners. Sometimes, a hematoma can develop after certain medical procedures.  SIGNS AND SYMPTOMS   · A firm lump on the body.  · Possible pain and tenderness in the area.  · Bruising. Blue, dark blue, purple-red, or yellowish skin may appear at the site of the hematoma if the hematoma is close to the surface of the skin.  For hematomas in deeper tissues or body spaces, the signs and symptoms may be subtle. For example, an intra-abdominal hematoma may cause abdominal pain, weakness, fainting, and shortness of breath. An intracranial hematoma may cause a headache or symptoms such as weakness, trouble speaking, or a change in consciousness.  DIAGNOSIS   A hematoma can usually be diagnosed based on your medical history and a physical exam. Imaging tests may be needed if your health care provider suspects a hematoma in deeper tissues or body spaces, such as the abdomen, head, or chest. These tests may include ultrasonography or a CT scan.   TREATMENT   Hematomas usually go away on their own over time. Rarely does the blood need to be drained out of the body. Large hematomas or those that may affect vital organs will sometimes need surgical drainage or monitoring.  HOME CARE INSTRUCTIONS   · Apply ice to the injured area:      Put ice in a  plastic bag.      Place a towel between your skin and the bag.      Leave the ice on for 20 minutes, 2-3 times a day for the first 1 to 2 days.    · After the first 2 days, switch to using warm compresses on the hematoma.    · Elevate the injured area to help decrease pain and swelling. Wrapping the area with an elastic bandage may also be helpful. Compression helps to reduce swelling and promotes shrinking of the hematoma. Make sure the bandage is not wrapped too tight.    · If your hematoma is on a lower extremity and is painful, crutches may be helpful for a couple days.    · Only take over-the-counter or prescription medicines as directed by your health care provider.  SEEK IMMEDIATE MEDICAL CARE IF:   · You have increasing pain, or your pain is not controlled with medicine.    · You have a fever.    · You have worsening swelling or discoloration.    · Your skin over the hematoma breaks or starts bleeding.    · Your hematoma is in your chest or abdomen and you have weakness, shortness of breath, or a change in consciousness.  · Your hematoma is on your scalp (caused by a fall or injury) and you have a worsening headache or a change in alertness or consciousness.  MAKE SURE YOU:   ·   Understand these instructions.  · Will watch your condition.  · Will get help right away if you are not doing well or get worse.     This information is not intended to replace advice given to you by your health care provider. Make sure you discuss any questions you have with your health care provider.     Document Released: 12/12/2003 Document Revised: 12/30/2012 Document Reviewed: 10/07/2012  Elsevier Interactive Patient Education ©2016 [employer] Inc[employer]

## 2016-03-08 ENCOUNTER — Encounter (HOSPITAL_COMMUNITY): Payer: Self-pay

## 2017-07-08 ENCOUNTER — Encounter (HOSPITAL_COMMUNITY): Payer: Self-pay

## 2017-08-09 ENCOUNTER — Encounter (HOSPITAL_BASED_OUTPATIENT_CLINIC_OR_DEPARTMENT_OTHER): Payer: Self-pay | Admitting: Emergency Medicine

## 2017-08-09 ENCOUNTER — Other Ambulatory Visit: Payer: Self-pay

## 2017-08-09 ENCOUNTER — Emergency Department (HOSPITAL_BASED_OUTPATIENT_CLINIC_OR_DEPARTMENT_OTHER)
Admission: EM | Admit: 2017-08-09 | Discharge: 2017-08-09 | Disposition: A | Discharge status: Home / Self Care | Payer: Worker's Compensation | Attending: Emergency Medicine | Admitting: Emergency Medicine

## 2017-08-09 DIAGNOSIS — Z9884 Bariatric surgery status: Secondary | ICD-10-CM | POA: Insufficient documentation

## 2017-08-09 DIAGNOSIS — Z79899 Other long term (current) drug therapy: Secondary | ICD-10-CM | POA: Insufficient documentation

## 2017-08-09 DIAGNOSIS — F17210 Nicotine dependence, cigarettes, uncomplicated: Secondary | ICD-10-CM | POA: Diagnosis not present

## 2017-08-09 DIAGNOSIS — M546 Pain in thoracic spine: Secondary | ICD-10-CM | POA: Diagnosis not present

## 2017-08-09 DIAGNOSIS — J45909 Unspecified asthma, uncomplicated: Secondary | ICD-10-CM | POA: Insufficient documentation

## 2017-08-09 DIAGNOSIS — M549 Dorsalgia, unspecified: Secondary | ICD-10-CM | POA: Diagnosis present

## 2017-08-09 HISTORY — DX: Unspecified asthma, uncomplicated: J45.909

## 2017-08-09 MED ORDER — CYCLOBENZAPRINE HCL 5 MG PO TABS: 5.0000 mg | ORAL_TABLET | Freq: Every evening | ORAL | 0 refills | Status: AC | PRN

## 2017-08-09 NOTE — ED Provider Notes (Signed)
[place] EMERGENCY DEPARTMENT Provider Note   CSN: [id number] Arrival date & time: 08/09/17  1751     History   Chief Complaint Chief Complaint  Patient presents with  . Back Pain    HPI Anna [name] is a 42 y.o. female presenting for evaluation of back pain.  Pt states she was moving someone to the stretches when she felt a pulling pain in her thoracic back. She lifted someone again, and felt the same pain. She took 600 mg ibuprofen without improvement. Pain has persisted, but not worsened. Movement makes the pain worse. Some positions (sitting with legs out) makes it better. She has a h/o back problems including DDD of lumbar back and herniated disc in the past. She denies radiation of the pain, but has intermittent tingling of the R buttock. She denies sxs radiation down her leg. No fevers, chills, numbness, loss of bowel or bladder control, weakness, saddle paresthesias. No h/o cancer or IVDU. She has a h/o asthma/copd, no other medical problems. She does not currently have a PCP or orthopedic doctor.  HPI  Past Medical History:  Diagnosis Date  . Allergy   . Asthma   . COPD (chronic obstructive pulmonary disease) ([place])    PFT 07/22/08-FEV1/FVC 0.49  . Degenerative disc disease   . Edema   . Family history of ischemic heart disease   . Family history of psychiatric condition   . GERD (gastroesophageal reflux disease)   . Glaucoma   . Obesity, unspecified   . Obstructive sleep apnea (adult) (pediatric)   . Pneumonia    hosp 2002  . Scimitar syndrome   . Sleep apnea    NPSG 09/09/07 AHI 8.1/hr prolong desat  . Tobacco use disorder     Patient Active Problem List   Diagnosis Date Noted  . Asthma exacerbation 02/14/2011  . Acute bronchitis 02/14/2011  . Routine general medical examination at a health care facility 08/24/2010  . Insomnia 08/24/2010  . CHIGGERS 10/06/2009  . OBSTRUCTIVE SLEEP APNEA 07/18/2009  . BARIATRIC SURGERY STATUS 07/18/2009    . GLAUCOMA 05/19/2008  . ALLERGIC RHINITIS 09/23/2007  . EDEMA 09/08/2007  . G E R D 07/12/2007  . 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 TOBACCO ABUSE 03/31/2007  . COPD 03/31/2007  . SCIMITAR SYNDROME 03/31/2007    Past Surgical History:  Procedure Laterality Date  . ABDOMINAL HYSTERECTOMY     partial- oophorectomy 1990  . EYE SURGERY    . GASTRIC BYPASS  6/09     OB History   None      Home Medications    Prior to Admission medications   Medication Sig Start Date End Date Taking? Authorizing Provider  albuterol (PROAIR HFA) 108 (90 BASE) MCG/ACT inhaler Inhale 2 puffs into the lungs 4 (four) times daily. 02/14/11   Janith Lima, MD  albuterol (PROVENTIL) (2.5 MG/3ML) 0.083% nebulizer solution Take 3 mLs (2.5 mg total) by nebulization as directed. 02/14/11   Janith Lima, MD  beclomethasone (QVAR) 80 MCG/ACT inhaler Inhale 2 puffs into the lungs 2 (two) times daily. 02/14/11   Janith Lima, MD  Biotin 1000 MCG tablet Take 1,000 mcg by mouth daily.      [provider]  brimonidine (ALPHAGAN P) 0.1 % SOLN as directed.      [provider]  brinzolamide (AZOPT) 1 % ophthalmic suspension 1 drop as directed.      [provider]  Calcium-Vitamin D-Vitamin K ([employer] CALCIUM CHEWS) 500-200-40 MG-UNT-MCG  CHEW Chew by mouth daily.      [provider]  chlorpheniramine-HYDROcodone (TUSSIONEX PENNKINETIC ER) 10-8 MG/5ML LQCR Take 5 mLs by mouth every 12 (twelve) hours. 06/25/11   Billy Fischer, MD  chlorpheniramine-HYDROcodone (TUSSIONEX) 10-8 MG/5ML LQCR Take 5 mLs by mouth every 12 (twelve) hours as needed. 01/15/12   Harden Mo, MD  cyclobenzaprine (FLEXERIL) 5 MG tablet Take 1 tablet (5 mg total) by mouth at bedtime as needed for muscle spasms. 08/09/17   Dajour Pierpoint, PA-C  Diphenhydramine-APAP, sleep, (TYLENOL PM EXTRA STRENGTH PO) Take by mouth.      [provider]  Fluticasone-Salmeterol (ADVAIR DISKUS) 250-50 MCG/DOSE AEPB  Inhale 1 puff into the lungs 2 (two) times daily. 1 puff and rise, twice daily     [provider]  HYDROcodone-acetaminophen (NORCO/VICODIN) 5-325 MG tablet Take 1-2 tablets by mouth every 4 (four) hours as needed. 11/18/15   Charlann Lange, PA-C  loratadine (CLARITIN) 10 MG tablet Take 10 mg by mouth daily.      [provider]  montelukast (SINGULAIR) 10 MG tablet Take 10 mg by mouth at bedtime.      [provider]  Multiple Vitamins-Iron (MULTI-VITAMIN/IRON) TABS Take 1 tablet by mouth 2 (two) times daily. Flintstones chew     [provider]  ondansetron (ZOFRAN) 4 MG tablet Take 1 tablet (4 mg total) by mouth every 8 (eight) hours as needed for nausea or vomiting. 11/14/15   Domenic Moras, PA-C  oxyCODONE-acetaminophen (PERCOCET/ROXICET) 5-325 MG tablet Take 1 tablet by mouth every 6 (six) hours as needed for moderate pain or severe pain. 11/14/15   Domenic Moras, PA-C  PredniSONE 5 MG KIT Take 1 kit (5 mg total) by mouth daily after breakfast. Prednisone 5 mg 6 day dosepack.  Take as directed. 01/15/12   Harden Mo, MD  Pseudoeph-Chlorphen-Hydrocod (ZUTRIPRO) 60-4-5 MG/5ML SOLN Take 5 mLs by mouth 4 (four) times daily as needed. 02/14/11   Janith Lima, MD  traMADol (ULTRAM) 50 MG tablet Take 50 mg by mouth every 6 (six) hours as needed.      [provider]  travoprost, benzalkonium, (TRAVATAN Z) 0.004 % ophthalmic solution Place 1 drop into both eyes at bedtime.      [provider]  varenicline (CHANTIX CONTINUING MONTH PAK) 1 MG tablet Take 1 mg by mouth as directed.      [provider]  vitamin B-12 (CYANOCOBALAMIN) 1000 MCG tablet Take 1,000 mcg by mouth daily.      [provider]  zolpidem (AMBIEN CR) 12.5 MG CR tablet Take 1 tablet (12.5 mg total) by mouth at bedtime as needed for sleep. 02/14/11 02/14/12  Janith Lima, MD    Family History Family History  Problem Relation Age of Onset  . Cancer Maternal  Grandfather        lung cancer  . Diabetes Paternal Grandmother        type 2  . COPD Other   . Asthma Other   . Heart disease Other        CAD  . Cancer Other        uterine cancer  . Depression Other   . Hyperlipidemia Other   . Hypertension Other     Social History Social History   Tobacco Use  . Smoking status: Current Every Day Smoker    Packs/day: 1.00    Types: Cigarettes, E-cigarettes    Last attempt to quit: 06/25/2010    Years  since quitting: 7.1  . Smokeless tobacco: Never Used  Substance Use Topics  . Alcohol use: Yes  . Drug use: No     Allergies   Tylenol [acetaminophen]   Review of Systems Review of Systems  Constitutional: Negative for chills and fever.  Genitourinary:       No loss of bowel or bladder control  Musculoskeletal: Positive for back pain. Negative for neck pain.  Skin: Negative for wound.  Neurological: Negative for light-headedness.  Hematological: Does not bruise/bleed easily.     Physical Exam Updated Vital Signs BP (!) 142/79 (BP Location: Left Arm)   Pulse 79   Temp 98.9 F (37.2 C) (Oral)   Resp 18   Ht _0  (1.727 m)   Wt 114.8 kg (253 lb)   SpO2 99%   BMI 38.47 kg/m   Physical Exam  Constitutional: She is oriented to person, place, and time. She appears well-developed and well-nourished. No distress.  HENT:  Head: Normocephalic and atraumatic.  Eyes: EOM are normal.  Neck: Normal range of motion.  No TTP of c-spine. Full ROM of head without pain  Cardiovascular: Normal rate, regular rhythm and intact distal pulses.  Pulmonary/Chest: Effort normal and breath sounds normal. No respiratory distress. She has no wheezes.  Abdominal: Soft. She exhibits no distension. There is no tenderness. There is no guarding.  Musculoskeletal: Normal range of motion. She exhibits tenderness.  TTP of low thoracic back, midline and bilateral paraspinal muscles. No TTP of lumbar or upper thoracic back. Full active ROM of upper  extremities without pain. No pain with SLR bilaterally. No saddle paresthesias. Strength intact x4. Sensation intact x4. No obvious deformities. Pt ambulatory  Neurological: She is alert and oriented to person, place, and time. She displays normal reflexes. No sensory deficit.  Skin: Skin is warm. No rash noted.  Psychiatric: She has a normal mood and affect.  Nursing note and vitals reviewed.    ED Treatments / Results  Labs (all labs ordered are listed, but only abnormal results are displayed) Labs Reviewed - No data to display  EKG None  Radiology No results found.  Procedures Procedures (including critical care time)  Medications Ordered in ED Medications - No data to display   Initial Impression / Assessment and Plan / ED Course  I have reviewed the triage vital signs and the nursing notes.  Pertinent labs & imaging results that were available during my care of the patient were reviewed by me and considered in my medical decision making (see chart for details).     Pt presenting for evaluation of back pain. Physical exam reassuring, no neurologic deficits. No red flags for back pain. Doubt vertebral injury, infection, spinal cord compression or myelopathy. Likely muscular. Will tx with NSAIDs, flexeril and rest. F/u with orthopedics as needed. At this time, pt appears safe for d/c. Return precautions given. Pt states she understands and agrees to plan.    Final Clinical Impressions(s) / ED Diagnoses   Final diagnoses:  Acute midline thoracic back pain    ED Discharge Orders        Ordered    cyclobenzaprine (FLEXERIL) 5 MG tablet  At bedtime PRN     08/09/17 1849       Franchot Heidelberg, PA-C 08/09/17 Theotis Burrow, MD 08/09/17 2337

## 2017-08-09 NOTE — ED Triage Notes (Signed)
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 Reports centralized lower back pain radiating to buttocks with numbness and tingling to R buttock.

## 2017-08-09 NOTE — Discharge Instructions (Addendum)
Take ibuprofen 3 times a day with meals.  Do not take other anti-inflammatories at the same time open (Advil, Motrin, naproxen, Aleve). You may supplement with Tylenol if you need further pain control. Use heating pads to help control your pain. Take flexeril as needed for pain. After 24 hours, try gentle stretches.  Follow up with the orthopedic doctor as needed if pain is not improving.  Return to the ER if your pain worsens significantly, you have new numbness, loss of bowel or bladder control, or any new or concerning symptoms.

## 2018-01-24 ENCOUNTER — Emergency Department
Admission: EM | Admit: 2018-01-24 | Discharge: 2018-01-24 | Disposition: A | Discharge status: Home / Self Care | Payer: BLUE CROSS/BLUE SHIELD | Attending: Emergency Medicine | Admitting: Emergency Medicine

## 2018-01-24 ENCOUNTER — Emergency Department: Payer: BLUE CROSS/BLUE SHIELD

## 2018-01-24 ENCOUNTER — Other Ambulatory Visit: Payer: Self-pay

## 2018-01-24 DIAGNOSIS — Y9301 Activity, walking, marching and hiking: Secondary | ICD-10-CM | POA: Insufficient documentation

## 2018-01-24 DIAGNOSIS — Y929 Unspecified place or not applicable: Secondary | ICD-10-CM | POA: Diagnosis not present

## 2018-01-24 DIAGNOSIS — Y999 Unspecified external cause status: Secondary | ICD-10-CM | POA: Insufficient documentation

## 2018-01-24 DIAGNOSIS — J449 Chronic obstructive pulmonary disease, unspecified: Secondary | ICD-10-CM | POA: Diagnosis not present

## 2018-01-24 DIAGNOSIS — S99921A Unspecified injury of right foot, initial encounter: Secondary | ICD-10-CM | POA: Diagnosis present

## 2018-01-24 DIAGNOSIS — F17210 Nicotine dependence, cigarettes, uncomplicated: Secondary | ICD-10-CM | POA: Diagnosis not present

## 2018-01-24 DIAGNOSIS — Z79899 Other long term (current) drug therapy: Secondary | ICD-10-CM | POA: Insufficient documentation

## 2018-01-24 DIAGNOSIS — S93601A Unspecified sprain of right foot, initial encounter: Secondary | ICD-10-CM | POA: Diagnosis not present

## 2018-01-24 DIAGNOSIS — W109XXA Fall (on) (from) unspecified stairs and steps, initial encounter: Secondary | ICD-10-CM | POA: Diagnosis not present

## 2018-01-24 MED ORDER — OXYCODONE HCL 5 MG PO TABS: 5.0000 mg | ORAL_TABLET | ORAL | 0 refills | Status: AC | PRN

## 2018-01-24 MED ORDER — OXYCODONE HCL 5 MG PO TABS
5.0000 mg | ORAL_TABLET | Freq: Once | ORAL | Status: AC
  Administered 2018-01-24: 5 mg via ORAL
  Filled 2018-01-24: qty 1

## 2018-01-24 NOTE — ED Provider Notes (Signed)
[facility] Emergency Department Provider Note   ____________________________________________   First MD Initiated Contact with Patient 01/24/18 [phone]     (approximate)  I have reviewed the triage vital signs and the nursing notes.   HISTORY  Chief Complaint Foot Injury   HPI Anna [name] is a 42 y.o. female patient works in the [place] at [employer].  She was getting off the bus and misstepped twisting her foot.  She complains a lot of pain and swelling in the midfoot and some tingling in the toes and instep.  She does not have any pain in her ankle.  She cannot bear weight on her foot.  Pain is moderately severe.  Injury happened within the last 24 hours   Past Medical History:  Diagnosis Date  . Allergy   . Asthma   . COPD (chronic obstructive pulmonary disease) ([place])    PFT 07/22/08-FEV1/FVC 0.49  . Degenerative disc disease   . Edema   . Family history of ischemic heart disease   . Family history of psychiatric condition   . GERD (gastroesophageal reflux disease)   . Glaucoma   . Obesity, unspecified   . Obstructive sleep apnea (adult) (pediatric)   . Pneumonia    hosp 2002  . Scimitar syndrome   . Sleep apnea    NPSG 09/09/07 AHI 8.1/hr prolong desat  . Tobacco use disorder     Patient Active Problem List   Diagnosis Date Noted  . Asthma exacerbation 02/14/2011  . Acute bronchitis 02/14/2011  . Routine general medical examination at a health care facility 08/24/2010  . Insomnia 08/24/2010  . CHIGGERS 10/06/2009  . OBSTRUCTIVE SLEEP APNEA 07/18/2009  . BARIATRIC SURGERY STATUS 07/18/2009  . GLAUCOMA 05/19/2008  . ALLERGIC RHINITIS 09/23/2007  . EDEMA 09/08/2007  . G E R D 07/12/2007  . 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 TOBACCO ABUSE 03/31/2007  . COPD 03/31/2007  . SCIMITAR SYNDROME 03/31/2007    Past Surgical History:  Procedure Laterality Date  . ABDOMINAL HYSTERECTOMY     partial- oophorectomy 1990  . EYE SURGERY    . GASTRIC  BYPASS  6/09    Prior to Admission medications   Medication Sig Start Date End Date Taking? Authorizing Provider  albuterol (PROAIR HFA) 108 (90 BASE) MCG/ACT inhaler Inhale 2 puffs into the lungs 4 (four) times daily. 02/14/11   Janith Lima, MD  albuterol (PROVENTIL) (2.5 MG/3ML) 0.083% nebulizer solution Take 3 mLs (2.5 mg total) by nebulization as directed. 02/14/11   Janith Lima, MD  beclomethasone (QVAR) 80 MCG/ACT inhaler Inhale 2 puffs into the lungs 2 (two) times daily. 02/14/11   Janith Lima, MD  Biotin 1000 MCG tablet Take 1,000 mcg by mouth daily.      [provider]  brimonidine (ALPHAGAN P) 0.1 % SOLN as directed.      [provider]  brinzolamide (AZOPT) 1 % ophthalmic suspension 1 drop as directed.      [provider]  Calcium-Vitamin D-Vitamin K ([employer] CALCIUM CHEWS) 768-115-72 MG-UNT-MCG CHEW Chew by mouth daily.      [provider]  chlorpheniramine-HYDROcodone (TUSSIONEX PENNKINETIC ER) 10-8 MG/5ML LQCR Take 5 mLs by mouth every 12 (twelve) hours. 06/25/11   Billy Fischer, MD  chlorpheniramine-HYDROcodone (TUSSIONEX) 10-8 MG/5ML LQCR Take 5 mLs by mouth every 12 (twelve) hours as needed. 01/15/12   Harden Mo, MD  cyclobenzaprine (FLEXERIL) 5 MG tablet Take 1 tablet (5 mg total) by mouth at  bedtime as needed for muscle spasms. 08/09/17   Caccavale, Sophia, PA-C  Diphenhydramine-APAP, sleep, (TYLENOL PM EXTRA STRENGTH PO) Take by mouth.      [provider]  Fluticasone-Salmeterol (ADVAIR DISKUS) 250-50 MCG/DOSE AEPB Inhale 1 puff into the lungs 2 (two) times daily. 1 puff and rise, twice daily     [provider]  HYDROcodone-acetaminophen (NORCO/VICODIN) 5-325 MG tablet Take 1-2 tablets by mouth every 4 (four) hours as needed. 11/18/15   Charlann Lange, PA-C  loratadine (CLARITIN) 10 MG tablet Take 10 mg by mouth daily.      [provider]  montelukast (SINGULAIR) 10 MG tablet Take 10 mg by mouth at  bedtime.      [provider]  Multiple Vitamins-Iron (MULTI-VITAMIN/IRON) TABS Take 1 tablet by mouth 2 (two) times daily. Flintstones chew     [provider]  ondansetron (ZOFRAN) 4 MG tablet Take 1 tablet (4 mg total) by mouth every 8 (eight) hours as needed for nausea or vomiting. 11/14/15   Domenic Moras, PA-C  oxyCODONE (OXY IR/ROXICODONE) 5 MG immediate release tablet Take 1 tablet (5 mg total) by mouth every 4 (four) hours as needed for severe pain. Try not to do more than 4 times a day. 01/24/18   Nena Polio, MD  oxyCODONE-acetaminophen (PERCOCET/ROXICET) 5-325 MG tablet Take 1 tablet by mouth every 6 (six) hours as needed for moderate pain or severe pain. 11/14/15   Domenic Moras, PA-C  PredniSONE 5 MG KIT Take 1 kit (5 mg total) by mouth daily after breakfast. Prednisone 5 mg 6 day dosepack.  Take as directed. 01/15/12   Harden Mo, MD  Pseudoeph-Chlorphen-Hydrocod (ZUTRIPRO) 60-4-5 MG/5ML SOLN Take 5 mLs by mouth 4 (four) times daily as needed. 02/14/11   Janith Lima, MD  traMADol (ULTRAM) 50 MG tablet Take 50 mg by mouth every 6 (six) hours as needed.      [provider]  travoprost, benzalkonium, (TRAVATAN Z) 0.004 % ophthalmic solution Place 1 drop into both eyes at bedtime.      [provider]  varenicline (CHANTIX CONTINUING MONTH PAK) 1 MG tablet Take 1 mg by mouth as directed.      [provider]  vitamin B-12 (CYANOCOBALAMIN) 1000 MCG tablet Take 1,000 mcg by mouth daily.      [provider]  zolpidem (AMBIEN CR) 12.5 MG CR tablet Take 1 tablet (12.5 mg total) by mouth at bedtime as needed for sleep. 02/14/11 02/14/12  Janith Lima, MD    Allergies Tylenol [acetaminophen]  Family History  Problem Relation Age of Onset  . Cancer Maternal Grandfather        lung cancer  . Diabetes Paternal Grandmother        type 2  . COPD Other   . Asthma Other   . Heart disease Other        CAD  . Cancer Other        uterine  cancer  . Depression Other   . Hyperlipidemia Other   . Hypertension Other     Social History Social History   Tobacco Use  . Smoking status: Current Every Day Smoker    Packs/day: 1.00    Types: Cigarettes, E-cigarettes    Last attempt to quit: 06/25/2010    Years since quitting: 7.5  . Smokeless tobacco: Never Used  Substance Use Topics  . Alcohol use: Yes  . Drug use: No    Review of Systems  Constitutional:  No fever/chills ENT: No sore throat. Cardiovascular: Denies chest pain. Respiratory: Denies shortness of breath. Gastrointestinal: No abdominal pain.  No nausea, no vomiting.   Musculoskeletal: Negative for back pain. Skin: Negative for rash. Neurological: Negative for headaches, focal weakness  Hematological/Lymphatic:   ____________________________________________   PHYSICAL EXAM:  VITAL SIGNS: ED Triage Vitals  Enc Vitals Group     BP 01/24/18 0018 117/60     Pulse Rate 01/24/18 0018 73     Resp 01/24/18 0018 20     Temp 01/24/18 0018 99.1 F (37.3 C)     Temp Source 01/24/18 0018 Oral     SpO2 01/24/18 0018 96 %     Weight 01/24/18 0011 273 lb (123.8 kg)     Height 01/24/18 0011 _0  (1.676 m)     Head Circumference --      Peak Flow --      Pain Score 01/24/18 0011 5     Pain Loc --      Pain Edu? --      Excl. in [place]? --     Constitutional: Alert and oriented. . Eyes: Conjunctivae are normal.  Head: Atraumatic. Nose: No congestion/rhinnorhea. Mouth/Throat: Mucous membranes are moist.  Oropharynx non-erythematous. Neck: No stridor. Cardiovascular: Normal rate, regular rhythm. Grossly normal heart sounds.  Good peripheral circulation. Respiratory: Normal respiratory effort.  No retractions. Lungs CTAB. Gastrointestinal: Soft and nontender. No distention. No abdominal bruits. No CVA tenderness. Musculoskeletal right foot looks somewhat swollen.  Is tender in the midfoot.  Has a good distal pulse and capillary refill though.  There is no  ankle tenderness at all or tenderness higher in the leg. Neurologic:  Normal speech and language. No gross focal neurologic deficits are appreciated except for some subjective tingling as described in HPI Skin:  Skin is warm, dry and intact. No rash noted. Psychiatric: Mood and affect are normal. Speech and behavior are normal.  ____________________________________________   LABS (all labs ordered are listed, but only abnormal results are displayed)  Labs Reviewed - No data to display ____________________________________________  EKG   ____________________________________________  RADIOLOGY  ED MD interpretation: X-ray of the foot read by radiology reviewed by me shows no acute fracture  Official radiology report(s): Dg Foot Complete Right  Result Date: 01/24/2018 CLINICAL DATA:  Right foot pain after injury today getting off a bus. EXAM: RIGHT FOOT COMPLETE - 3+ VIEW COMPARISON:  None. FINDINGS: There is no evidence of fracture or dislocation. There is no evidence of arthropathy or other focal bone abnormality. Soft tissues are unremarkable. IMPRESSION: Negative radiographs of the right foot. Electronically Signed   By: Keith Rake M.D.   On: 01/24/2018 00:44    ____________________________________________   PROCEDURES  Procedure(s) performed:   Procedures  Critical Care performed:   ____________________________________________   INITIAL IMPRESSION / ASSESSMENT AND PLAN / ED COURSE  Discussed with patient that in my experience foot x-rays but can sometimes be negative early on and develop a fracture but shows up later.  We will give her splint crutches and some pain meds as she is in a lot of pain.  She will follow-up with orthopedics.  She may need a re-x-ray in a week.         ____________________________________________   FINAL CLINICAL IMPRESSION(S) / ED DIAGNOSES  Final diagnoses:  Sprain of right foot, initial encounter     ED Discharge  Orders         Ordered    oxyCODONE (OXY IR/ROXICODONE)  5 MG immediate release tablet  Every 4 hours PRN     01/24/18 0536           Note:  This document was prepared using Dragon voice recognition software and may include unintentional dictation errors.    Nena Polio, MD 01/24/18 [phone]

## 2018-01-24 NOTE — Discharge Instructions (Addendum)
Use ice on the foot tonight.  You can try heat tomorrow.  Be careful not to fall asleep with either ice or heat on it you will get burns.  Weightbearing as tolerated.  Use your crutches.  Use the foot splint we gave you.  Keep your foot up as much as possible.  Please follow-up either with our orthopedic surgeons or [facility].  Call them Monday for an appointment either Friday or next Monday.  Use the oxycodone 4 times a day if needed for pain.  Otherwise use Motrin.  Please return for worse pain swelling or any numbness.  Remember what I told you about sometimes foot fractures do not show up initially.  Be careful the oxycodone can make you constipated and woozy.  Do not drive on it.

## 2018-01-24 NOTE — ED Triage Notes (Signed)
Pt injured injured right foot today getting off a bus to go to a parking garage. Pt states cannot bear weight on foot currently.

## 2018-01-24 NOTE — ED Notes (Signed)
This RN reviewed discharge instructions, follow-up care, prescriptions, cryotherapy, crutch use, and need for elevation with patient. Patient verbalized understanding of all reviewed information.  Patient stable, with no distress noted at this time. 

## 2019-08-17 ENCOUNTER — Ambulatory Visit: Payer: BLUE CROSS/BLUE SHIELD | Admitting: Cardiology

## 2019-08-18 ENCOUNTER — Encounter: Payer: Self-pay | Admitting: Emergency Medicine

## 2019-08-18 ENCOUNTER — Other Ambulatory Visit: Payer: Self-pay

## 2019-08-18 ENCOUNTER — Emergency Department
Admission: EM | Admit: 2019-08-18 | Discharge: 2019-08-18 | Disposition: A | Discharge status: Home / Self Care | Payer: BC Managed Care – PPO | Attending: Emergency Medicine | Admitting: Emergency Medicine

## 2019-08-18 DIAGNOSIS — Z9884 Bariatric surgery status: Secondary | ICD-10-CM | POA: Insufficient documentation

## 2019-08-18 DIAGNOSIS — J449 Chronic obstructive pulmonary disease, unspecified: Secondary | ICD-10-CM | POA: Diagnosis not present

## 2019-08-18 DIAGNOSIS — R002 Palpitations: Secondary | ICD-10-CM | POA: Diagnosis present

## 2019-08-18 DIAGNOSIS — I471 Supraventricular tachycardia: Secondary | ICD-10-CM

## 2019-08-18 DIAGNOSIS — Z79899 Other long term (current) drug therapy: Secondary | ICD-10-CM | POA: Insufficient documentation

## 2019-08-18 DIAGNOSIS — J45909 Unspecified asthma, uncomplicated: Secondary | ICD-10-CM | POA: Diagnosis not present

## 2019-08-18 DIAGNOSIS — F17210 Nicotine dependence, cigarettes, uncomplicated: Secondary | ICD-10-CM | POA: Diagnosis not present

## 2019-08-18 LAB — BASIC METABOLIC PANEL
Anion gap: 9 (ref 5–15)
BUN: 17 mg/dL (ref 6–20)
CO2: 26 mmol/L (ref 22–32)
Calcium: 8.8 mg/dL — ABNORMAL LOW (ref 8.9–10.3)
Chloride: 101 mmol/L (ref 98–111)
Creatinine, Ser: 0.93 mg/dL (ref 0.44–1.00)
GFR calc Af Amer: >60 mL/min (ref >60)
GFR calc non Af Amer: >60 mL/min (ref >60)
Glucose, Bld: 94 mg/dL (ref 70–99)
Potassium: 3.7 mmol/L (ref 3.5–5.1)
Sodium: 136 mmol/L (ref 135–145)

## 2019-08-18 LAB — CBC
HCT: 43.3 % (ref 36.0–46.0)
Hemoglobin: 14.5 g/dL (ref 12.0–15.0)
MCH: 32.6 pg (ref 26.0–34.0)
MCHC: 33.5 g/dL (ref 30.0–36.0)
MCV: 97.3 fL (ref 80.0–100.0)
Platelets: 233 10*3/uL (ref 150–400)
RBC: 4.45 MIL/uL (ref 3.87–5.11)
RDW: 11.9 % (ref 11.5–15.5)
WBC: 7.2 10*3/uL (ref 4.0–10.5)
nRBC: 0 % (ref 0.0–0.2)

## 2019-08-18 MED ORDER — METOPROLOL TARTRATE 25 MG PO TABS: 25.0000 mg | ORAL_TABLET | Freq: Two times a day (BID) | ORAL | 0 refills | Status: AC

## 2019-08-18 NOTE — Discharge Instructions (Addendum)
Take the metoprolol as prescribed.  Follow-up with Dr. Tami Ribas as scheduled.  Return here or to the nearest ER for any new, worsening, or recurrent palpitations, chest pain, shortness of breath, weakness or lightheadedness, or any other new or worsening symptoms that concern you.

## 2019-08-18 NOTE — ED Provider Notes (Signed)
[facility] Emergency Department Provider Note ____________________________________________   None    (approximate)  I have reviewed the triage vital signs and the nursing notes.   HISTORY  Chief Complaint svt    HPI Anna [name] is a 44 y.o. female with PMH as noted below as well as a recent diagnosis of SVT who presents with an episode of palpitations and an arrhythmia on her Holter monitor.  The patient saw a cardiologist at [facility] last week and has been on a Holter monitor for approximately the last week.  She states that she woke up with palpitations, some burning type chest pain, and shortness of breath.  She then received a phone call from the Holter monitor company stating that she was in an abnormal rhythm.  She attempted vagal maneuvers at home including drinking ice water with no relief.  However, shortly after she arrived here the symptoms resolved.  She states that her last episode was about 3 weeks ago.  Past Medical History:  Diagnosis Date  . Allergy   . Asthma   . COPD (chronic obstructive pulmonary disease) ([place])    PFT 07/22/08-FEV1/FVC 0.49  . Degenerative disc disease   . Edema   . Family history of ischemic heart disease   . Family history of psychiatric condition   . GERD (gastroesophageal reflux disease)   . Glaucoma   . Obesity, unspecified   . Obstructive sleep apnea (adult) (pediatric)   . Pneumonia    hosp 2002  . Scimitar syndrome   . Sleep apnea    NPSG 09/09/07 AHI 8.1/hr prolong desat  . Tobacco use disorder     Patient Active Problem List   Diagnosis Date Noted  . Asthma exacerbation 02/14/2011  . Acute bronchitis 02/14/2011  . Routine general medical examination at a health care facility 08/24/2010  . Insomnia 08/24/2010  . CHIGGERS 10/06/2009  . OBSTRUCTIVE SLEEP APNEA 07/18/2009  . BARIATRIC SURGERY STATUS 07/18/2009  . GLAUCOMA 05/19/2008  . ALLERGIC RHINITIS 09/23/2007  . EDEMA 09/08/2007  . G E R D  07/12/2007  . 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 TOBACCO ABUSE 03/31/2007  . COPD 03/31/2007  . SCIMITAR SYNDROME 03/31/2007    Past Surgical History:  Procedure Laterality Date  . ABDOMINAL HYSTERECTOMY     partial- oophorectomy 1990  . EYE SURGERY    . GASTRIC BYPASS  6/09    Prior to Admission medications   Medication Sig Start Date End Date Taking? Authorizing Provider  albuterol (PROAIR HFA) 108 (90 BASE) MCG/ACT inhaler Inhale 2 puffs into the lungs 4 (four) times daily. 02/14/11   Janith Lima, MD  albuterol (PROVENTIL) (2.5 MG/3ML) 0.083% nebulizer solution Take 3 mLs (2.5 mg total) by nebulization as directed. 02/14/11   Janith Lima, MD  beclomethasone (QVAR) 80 MCG/ACT inhaler Inhale 2 puffs into the lungs 2 (two) times daily. 02/14/11   Janith Lima, MD  Biotin 1000 MCG tablet Take 1,000 mcg by mouth daily.      [provider]  brimonidine (ALPHAGAN P) 0.1 % SOLN as directed.      [provider]  brinzolamide (AZOPT) 1 % ophthalmic suspension 1 drop as directed.      [provider]  Calcium-Vitamin D-Vitamin K ([employer] CALCIUM CHEWS) 308-657-84 MG-UNT-MCG CHEW Chew by mouth daily.      [provider]  chlorpheniramine-HYDROcodone (TUSSIONEX PENNKINETIC ER) 10-8 MG/5ML LQCR Take 5 mLs by mouth every 12 (twelve) hours. 06/25/11   Kindl,  Nelda Severe, MD  chlorpheniramine-HYDROcodone (TUSSIONEX) 10-8 MG/5ML LQCR Take 5 mLs by mouth every 12 (twelve) hours as needed. 01/15/12   Harden Mo, MD  cyclobenzaprine (FLEXERIL) 5 MG tablet Take 1 tablet (5 mg total) by mouth at bedtime as needed for muscle spasms. 08/09/17   Caccavale, Sophia, PA-C  Diphenhydramine-APAP, sleep, (TYLENOL PM EXTRA STRENGTH PO) Take by mouth.      [provider]  Fluticasone-Salmeterol (ADVAIR DISKUS) 250-50 MCG/DOSE AEPB Inhale 1 puff into the lungs 2 (two) times daily. 1 puff and rise, twice daily     [provider]  HYDROcodone-acetaminophen  (NORCO/VICODIN) 5-325 MG tablet Take 1-2 tablets by mouth every 4 (four) hours as needed. 11/18/15   Charlann Lange, PA-C  loratadine (CLARITIN) 10 MG tablet Take 10 mg by mouth daily.      [provider]  metoprolol tartrate (LOPRESSOR) 25 MG tablet Take 1 tablet (25 mg total) by mouth 2 (two) times daily. 08/18/19 09/17/19  Arta Silence, MD  montelukast (SINGULAIR) 10 MG tablet Take 10 mg by mouth at bedtime.      [provider]  Multiple Vitamins-Iron (MULTI-VITAMIN/IRON) TABS Take 1 tablet by mouth 2 (two) times daily. Flintstones chew     [provider]  ondansetron (ZOFRAN) 4 MG tablet Take 1 tablet (4 mg total) by mouth every 8 (eight) hours as needed for nausea or vomiting. 11/14/15   Domenic Moras, PA-C  oxyCODONE (OXY IR/ROXICODONE) 5 MG immediate release tablet Take 1 tablet (5 mg total) by mouth every 4 (four) hours as needed for severe pain. Try not to do more than 4 times a day. 01/24/18   Nena Polio, MD  oxyCODONE-acetaminophen (PERCOCET/ROXICET) 5-325 MG tablet Take 1 tablet by mouth every 6 (six) hours as needed for moderate pain or severe pain. 11/14/15   Domenic Moras, PA-C  PredniSONE 5 MG KIT Take 1 kit (5 mg total) by mouth daily after breakfast. Prednisone 5 mg 6 day dosepack.  Take as directed. 01/15/12   Harden Mo, MD  Pseudoeph-Chlorphen-Hydrocod (ZUTRIPRO) 60-4-5 MG/5ML SOLN Take 5 mLs by mouth 4 (four) times daily as needed. 02/14/11   Janith Lima, MD  traMADol (ULTRAM) 50 MG tablet Take 50 mg by mouth every 6 (six) hours as needed.      [provider]  travoprost, benzalkonium, (TRAVATAN Z) 0.004 % ophthalmic solution Place 1 drop into both eyes at bedtime.      [provider]  varenicline (CHANTIX CONTINUING MONTH PAK) 1 MG tablet Take 1 mg by mouth as directed.      [provider]  vitamin B-12 (CYANOCOBALAMIN) 1000 MCG tablet Take 1,000 mcg by mouth daily.      [provider]  zolpidem (AMBIEN CR)  12.5 MG CR tablet Take 1 tablet (12.5 mg total) by mouth at bedtime as needed for sleep. 02/14/11 02/14/12  Janith Lima, MD    Allergies Tylenol [acetaminophen]  Family History  Problem Relation Age of Onset  . Cancer Maternal Grandfather        lung cancer  . Diabetes Paternal Grandmother        type 2  . COPD Other   . Asthma Other   . Heart disease Other        CAD  . Cancer Other        uterine cancer  . Depression Other   . Hyperlipidemia Other   . Hypertension Other     Social History Social  History   Tobacco Use  . Smoking status: Current Every Day Smoker    Packs/day: 1.00    Types: Cigarettes, E-cigarettes    Last attempt to quit: 06/25/2010    Years since quitting: 9.1  . Smokeless tobacco: Never Used  Substance Use Topics  . Alcohol use: Yes  . Drug use: No    Review of Systems  Constitutional: No fever/chills. Eyes: No redness. ENT: No sore throat. Cardiovascular: Positive for resolved chest pain. Respiratory: Positive for resolved shortness of breath. Gastrointestinal: No vomiting. Genitourinary: Negative for flank pain.  Musculoskeletal: Negative for back pain. Skin: Negative for rash. Neurological: Negative for headaches, focal weakness or numbness.   ____________________________________________   PHYSICAL EXAM:  VITAL SIGNS: ED Triage Vitals  Enc Vitals Group     BP 08/18/19 0736 123/89     Pulse Rate 08/18/19 0736 69     Resp 08/18/19 0736 16     Temp --      Temp Source 08/18/19 0736 Oral     SpO2 08/18/19 0736 100 %     Weight 08/18/19 0735 272 lb 14.9 oz (123.8 kg)     Height 08/18/19 0735 '5\' 6"'$  (1.676 m)     Head Circumference --      Peak Flow --      Pain Score 08/18/19 0734 0     Pain Loc --      Pain Edu? --      Excl. in [place]? --     Constitutional: Alert and oriented. Well appearing and in no acute distress. Eyes: Conjunctivae are normal.  Head: Atraumatic. Nose: No congestion/rhinnorhea. Mouth/Throat: Mucous  membranes are moist.   Neck: Normal range of motion.  Cardiovascular: Normal rate, regular rhythm. Grossly normal heart sounds.  Good peripheral circulation. Respiratory: Normal respiratory effort.  No retractions. Lungs CTAB. Gastrointestinal: No distention.  Musculoskeletal: No lower extremity edema.  Extremities warm and well perfused.  Neurologic:  Normal speech and language. No gross focal neurologic deficits are appreciated.  Skin:  Skin is warm and dry. No rash noted. Psychiatric: Mood and affect are normal. Speech and behavior are normal.  ____________________________________________   LABS (all labs ordered are listed, but only abnormal results are displayed)  Labs Reviewed  BASIC METABOLIC PANEL - Abnormal; Notable for the following components:      Result Value   Calcium 8.8 (*)    All other components within normal limits  CBC   ____________________________________________  EKG  ED ECG REPORT I, Arta Silence, the attending physician, personally viewed and interpreted this ECG.  Date: 08/18/2019 EKG Time: 0733 Rate: 77 Rhythm: normal sinus rhythm QRS Axis: normal Intervals: Incomplete RBBB ST/T Wave abnormalities: normal Narrative Interpretation: no evidence of acute ischemia  ____________________________________________  RADIOLOGY    ____________________________________________   PROCEDURES  Procedure(s) performed: No  Procedures  Critical Care performed: No ____________________________________________   INITIAL IMPRESSION / ASSESSMENT AND PLAN / ED COURSE  Pertinent labs & imaging results that were available during my care of the patient were reviewed by me and considered in my medical decision making (see chart for details).  44 year old female with PMH as noted above as well as a recent diagnosis of SVT presents with an episode of tachycardia.  She was woken up with palpitations and then received a phone call from the Holter monitor  company.  When she arrived in the ED her heart rate was in the 180s, but by the time an EKG was obtained she had converted  to sinus rhythm in the 70s.  The patient denies any symptoms now.  I reviewed the past medical records in [place] and confirmed that the patient was evaluated by cardiology at [facility] last weekend a Holter monitor was ordered.  The patient has not been started on any medications for this.  On exam, the patient is well-appearing.  Her vital signs are now normal.  The physical exam is otherwise unremarkable.  EKG shows normal sinus rhythm.  Presentation is consistent with resolved SVT.  We will contact the Holter company to confirm the rhythm and I will contact the patient's cardiologist.  Although the patient did have some chest discomfort, given that this was in the context of a tachydysrhythmia and the patient has no history of CAD and a normal EKG now, there is no evidence of ACS and no indication for cardiac enzymes.   ----------------------------------------- 9:10 AM on 08/18/2019 -----------------------------------------  We received the faxed report from the Holter monitor.  The rhythm is SVT, although there is a 12-second run of a wide-complex tachycardia during the initial SVT episode.  I contacted Dr. Flonnie Overman from cardiology at [facility] who is following the patient.  We discussed the patient's presentation and the Holter monitor findings.  Based on the nature of the Holter reading, she feels that the short period of of a wide QRS is aberrant conduction rather than true ventricular tachycardia.  The patient had a normal echocardiogram last year.  Dr. Flonnie Overman recommends starting the patient on metoprolol and agrees with discharge today and to follow up with her.    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 She agrees with the plan of care and is eager to go home.  Return precautions given and  she expresses understanding.     FINAL CLINICAL IMPRESSION(S) / ED DIAGNOSES  Final diagnoses:  SVT (supraventricular tachycardia) (HCC)      NEW MEDICATIONS STARTED DURING THIS VISIT:  New Prescriptions   METOPROLOL TARTRATE (LOPRESSOR) 25 MG TABLET    Take 1 tablet (25 mg total) by mouth 2 (two) times daily.     Note:  This document was prepared using Dragon voice recognition software and may include unintentional dictation errors.   Arta Silence, MD 08/18/19 [phone]

## 2019-08-18 NOTE — ED Notes (Signed)
Pt up to bathroom.  HR 103 after walking, dr siadecki notified.  NAD.

## 2019-08-18 NOTE — ED Triage Notes (Signed)
C/O SVT.  Patient wearing Holter monitor and was called to alert her to elevated heart rate.  On arrival, pulse checked in lobby, 185.  Patient AAOx3.  Skin warm and dry. No SOB/ DOE.  Patient states she tried multiple vagal maneuvers at home PTA, no effect.

## 2021-07-25 ENCOUNTER — Ambulatory Visit
Admission: EM | Admit: 2021-07-25 | Discharge: 2021-07-25 | Disposition: A | Discharge status: Home / Self Care | Payer: No Typology Code available for payment source | Attending: Emergency Medicine | Admitting: Emergency Medicine

## 2021-07-25 ENCOUNTER — Other Ambulatory Visit: Payer: Self-pay

## 2021-07-25 ENCOUNTER — Ambulatory Visit (INDEPENDENT_AMBULATORY_CARE_PROVIDER_SITE_OTHER): Payer: No Typology Code available for payment source

## 2021-07-25 ENCOUNTER — Encounter: Payer: Self-pay | Admitting: Emergency Medicine

## 2021-07-25 DIAGNOSIS — R059 Cough, unspecified: Secondary | ICD-10-CM

## 2021-07-25 DIAGNOSIS — R0602 Shortness of breath: Secondary | ICD-10-CM

## 2021-07-25 DIAGNOSIS — R058 Other specified cough: Secondary | ICD-10-CM

## 2021-07-25 DIAGNOSIS — J441 Chronic obstructive pulmonary disease with (acute) exacerbation: Secondary | ICD-10-CM

## 2021-07-25 MED ORDER — PREDNISONE 10 MG PO TABS: 40.0000 mg | ORAL_TABLET | Freq: Every day | ORAL | 0 refills | Status: AC

## 2021-07-25 NOTE — ED Triage Notes (Signed)
Pt presents with cough, SOB, and chest congestion pt wasn't feeling good x 1 week, but symptoms became worse yesterday.  ?

## 2021-07-25 NOTE — ED Provider Notes (Signed)
?[facility] ? ? ? ?CSN: [id number] ?Arrival date & time: 07/25/21  1052 ? ? ?  ? ?History   ?Chief Complaint ?Chief Complaint  ?Patient presents with  ? Cough  ? Shortness of Breath  ? ? ?HPI ?Anna [name] is a 46 y.o. female.  Patient presents with 2-day history of chest congestion and cough.  She feels short of breath with exertion.  She had similar symptoms a week ago but felt better, then worse again.  No fever, rash, sore throat, vomiting, diarrhea, or other symptoms.  Her medical history includes COPD, morbid obesity, former smoker, obstructive sleep apnea, GERD, gastric bypass in 2009.  She vapes occasionally. ? ?The history is provided by the patient and medical records.  ? ?Past Medical History:  ?Diagnosis Date  ? Allergy   ? Asthma   ? COPD (chronic obstructive pulmonary disease) (HCC)   ? PFT 07/22/08-FEV1/FVC 0.49  ? Degenerative disc disease   ? Edema   ? Family history of ischemic heart disease   ? Family history of psychiatric condition   ? GERD (gastroesophageal reflux disease)   ? Glaucoma   ? Obesity, unspecified   ? Obstructive sleep apnea (adult) (pediatric)   ? Pneumonia   ? hosp 2002  ? Scimitar syndrome   ? Sleep apnea   ? NPSG 09/09/07 AHI 8.1/hr prolong desat  ? Tobacco use disorder   ? ? ?Patient Active Problem List  ? Diagnosis Date Noted  ? Asthma exacerbation 02/14/2011  ? Acute bronchitis 02/14/2011  ? Routine general medical examination at a health care facility 08/24/2010  ? Insomnia 08/24/2010  ? CHIGGERS 10/06/2009  ? OBSTRUCTIVE SLEEP APNEA 07/18/2009  ? BARIATRIC SURGERY STATUS 07/18/2009  ? GLAUCOMA 05/19/2008  ? ALLERGIC RHINITIS 09/23/2007  ? EDEMA 09/08/2007  ? G E R D 07/12/2007  ? EXOGENOUS OBESITY 03/31/2007  ? TOBACCO ABUSE 03/31/2007  ? COPD 03/31/2007  ? SCIMITAR SYNDROME 03/31/2007  ? ? ?Past Surgical History:  ?Procedure Laterality Date  ? ABDOMINAL HYSTERECTOMY    ? partial- oophorectomy 1990  ? EYE SURGERY    ? GASTRIC BYPASS  6/09  ? ? ?OB History   ?No  obstetric history on file. ?  ? ? ? ?Home Medications   ? ?Prior to Admission medications   ?Medication Sig Start Date End Date Taking? Authorizing Provider  ?albuterol (PROAIR HFA) 108 (90 BASE) MCG/ACT inhaler Inhale 2 puffs into the lungs 4 (four) times daily. 02/14/11  Yes Etta Grandchild, MD  ?albuterol (PROVENTIL) (2.5 MG/3ML) 0.083% nebulizer solution Take 3 mLs (2.5 mg total) by nebulization as directed. 02/14/11  Yes Etta Grandchild, MD  ?metoprolol tartrate (LOPRESSOR) 25 MG tablet Take 1 tablet (25 mg total) by mouth 2 (two) times daily. 08/18/19 07/25/21 Yes Dionne Bucy, MD  ?predniSONE (DELTASONE) 10 MG tablet Take 4 tablets (40 mg total) by mouth daily for 5 days. 07/25/21 07/30/21 Yes Mickie Bail, NP  ?beclomethasone (QVAR) 80 MCG/ACT inhaler Inhale 2 puffs into the lungs 2 (two) times daily. 02/14/11   Etta Grandchild, MD  ?Biotin 1000 MCG tablet Take 1,000 mcg by mouth daily.      [provider]  ?brimonidine (ALPHAGAN P) 0.1 % SOLN as directed.      [provider]  ?brinzolamide (AZOPT) 1 % ophthalmic suspension 1 drop as directed.      [provider]  ?Calcium-Vitamin D-Vitamin K ([employer] CALCIUM CHEWS) 500-200-40 MG-UNT-MCG CHEW Chew by mouth daily.  [provider]  ?chlorpheniramine-HYDROcodone (TUSSIONEX PENNKINETIC ER) 10-8 MG/5ML LQCR Take 5 mLs by mouth every 12 (twelve) hours. 06/25/11   Linna HoffKindl, James D, MD  ?chlorpheniramine-HYDROcodone (TUSSIONEX) 10-8 MG/5ML LQCR Take 5 mLs by mouth every 12 (twelve) hours as needed. 01/15/12   Reuben LikesKeller, David C, MD  ?cyclobenzaprine (FLEXERIL) 5 MG tablet Take 1 tablet (5 mg total) by mouth at bedtime as needed for muscle spasms. 08/09/17   Caccavale, Sophia, PA-C  ?Diphenhydramine-APAP, sleep, (TYLENOL PM EXTRA STRENGTH PO) Take by mouth.      [provider]  ?Fluticasone-Salmeterol (ADVAIR DISKUS) 250-50 MCG/DOSE AEPB Inhale 1 puff into the lungs 2 (two) times daily. 1 puff and rise, twice daily      [provider]  ?HYDROcodone-acetaminophen (NORCO/VICODIN) 5-325 MG tablet Take 1-2 tablets by mouth every 4 (four) hours as needed. 11/18/15   Elpidio AnisUpstill, Shari, PA-C  ?loratadine (CLARITIN) 10 MG tablet Take 10 mg by mouth daily.      [provider]  ?montelukast (SINGULAIR) 10 MG tablet Take 10 mg by mouth at bedtime.      [provider]  ?Multiple Vitamins-Iron (MULTI-VITAMIN/IRON) TABS Take 1 tablet by mouth 2 (two) times daily. Flintstones chew     [provider]  ?ondansetron (ZOFRAN) 4 MG tablet Take 1 tablet (4 mg total) by mouth every 8 (eight) hours as needed for nausea or vomiting. 11/14/15   Fayrene Helperran, Bowie, PA-C  ?oxyCODONE (OXY IR/ROXICODONE) 5 MG immediate release tablet Take 1 tablet (5 mg total) by mouth every 4 (four) hours as needed for severe pain. Try not to do more than 4 times a day. 01/24/18   Arnaldo NatalMalinda, Paul F, MD  ?oxyCODONE-acetaminophen (PERCOCET/ROXICET) 5-325 MG tablet Take 1 tablet by mouth every 6 (six) hours as needed for moderate pain or severe pain. 11/14/15   Fayrene Helperran, Bowie, PA-C  ?Pseudoeph-Chlorphen-Hydrocod (ZUTRIPRO) 60-4-5 MG/5ML SOLN Take 5 mLs by mouth 4 (four) times daily as needed. 02/14/11   Etta GrandchildJones, Thomas L, MD  ?traMADol (ULTRAM) 50 MG tablet Take 50 mg by mouth every 6 (six) hours as needed.      [provider]  ?travoprost, benzalkonium, (TRAVATAN Z) 0.004 % ophthalmic solution Place 1 drop into both eyes at bedtime.      [provider]  ?varenicline (CHANTIX CONTINUING MONTH PAK) 1 MG tablet Take 1 mg by mouth as directed.      [provider]  ?vitamin B-12 (CYANOCOBALAMIN) 1000 MCG tablet Take 1,000 mcg by mouth daily.      [provider]  ?zolpidem (AMBIEN CR) 12.5 MG CR tablet Take 1 tablet (12.5 mg total) by mouth at bedtime as needed for sleep. 02/14/11 02/14/12  Etta GrandchildJones, Thomas L, MD  ? ? ?Family History ?Family History  ?Problem Relation Age of Onset  ? Cancer Maternal Grandfather   ?     lung cancer  ?  Diabetes Paternal Grandmother   ?     type 2  ? COPD Other   ? Asthma Other   ? Heart disease Other   ?     CAD  ? Cancer Other   ?     uterine cancer  ? Depression Other   ? Hyperlipidemia Other   ? Hypertension Other   ? ? ?Social History ?Social History  ? ?Tobacco Use  ? Smoking status: Former  ?  Packs/day: 1.00  ?  Types: Cigarettes, E-cigarettes  ?  Quit date: 06/25/2010  ?  Years since quitting: 11.0  ?  Smokeless tobacco: Never  ?Vaping Use  ? Vaping Use: Some days  ?Substance Use Topics  ? Alcohol use: Yes  ? Drug use: No  ? ? ? ?Allergies   ?Tylenol [acetaminophen] ? ? ?Review of Systems ?Review of Systems  ?Constitutional:  Negative for chills and fever.  ?HENT:  Positive for congestion. Negative for ear pain and sore throat.   ?Respiratory:  Positive for cough and shortness of breath.   ?Cardiovascular:  Negative for chest pain and palpitations.  ?All other systems reviewed and are negative. ? ? ?Physical Exam ?Triage Vital Signs ?ED Triage Vitals  ?Enc Vitals Group  ?   BP 07/25/21 1103 (!) 153/80  ?   Pulse Rate 07/25/21 1103 82  ?   Resp 07/25/21 1103 18  ?   Temp 07/25/21 1103 98.3 ?F (36.8 ?C)  ?   Temp src --   ?   SpO2 07/25/21 1103 95 %  ?   Weight --   ?   Height --   ?   Head Circumference --   ?   Peak Flow --   ?   Pain Score 07/25/21 1102 0  ?   Pain Loc --   ?   Pain Edu? --   ?   Excl. in [place]? --   ? ?No data found. ? ?Updated Vital Signs ?BP (!) 153/80   Pulse 82   Temp 98.3 ?F (36.8 ?C)   Resp 18   SpO2 95%  ? ?Visual Acuity ?Right Eye Distance:   ?Left Eye Distance:   ?Bilateral Distance:   ? ?Right Eye Near:   ?Left Eye Near:    ?Bilateral Near:    ? ?Physical Exam ?Vitals and nursing note reviewed.  ?Constitutional:   ?   General: She is not in acute distress. ?   Appearance: She is well-developed. She is obese. She is not ill-appearing.  ?HENT:  ?   Right Ear: Tympanic membrane normal.  ?   Left Ear: Tympanic membrane normal.  ?   Nose: Nose normal.  ?   Mouth/Throat:  ?   Mouth:  Mucous membranes are moist.  ?   Pharynx: Oropharynx is clear.  ?Cardiovascular:  ?   Rate and Rhythm: Normal rate and regular rhythm.  ?   Heart sounds: Normal heart sounds.  ?Pulmonary:  ?   Effort: Pulmonary

## 2021-07-25 NOTE — Discharge Instructions (Addendum)
Take the prednisone as directed.  Continue using your albuterol inhaler and nebulizer as directed.  Follow-up with your primary care provider.   ? ?Go to the emergency department if you have acute shortness of breath or other concerning symptoms.  ? ?

## 2022-01-12 LAB — COLOGUARD: COLOGUARD: NEGATIVE

## 2022-04-29 IMAGING — DX DG CHEST 2V
4 series · 4 of 4 positions shown · non-contrast
Comparison: AP chest 05/01/2010

CLINICAL DATA: Productive cough. Shortness of breath. History of
COPD.

EXAM:
CHEST - 2 VIEW

[chest pa (1 of 2)]
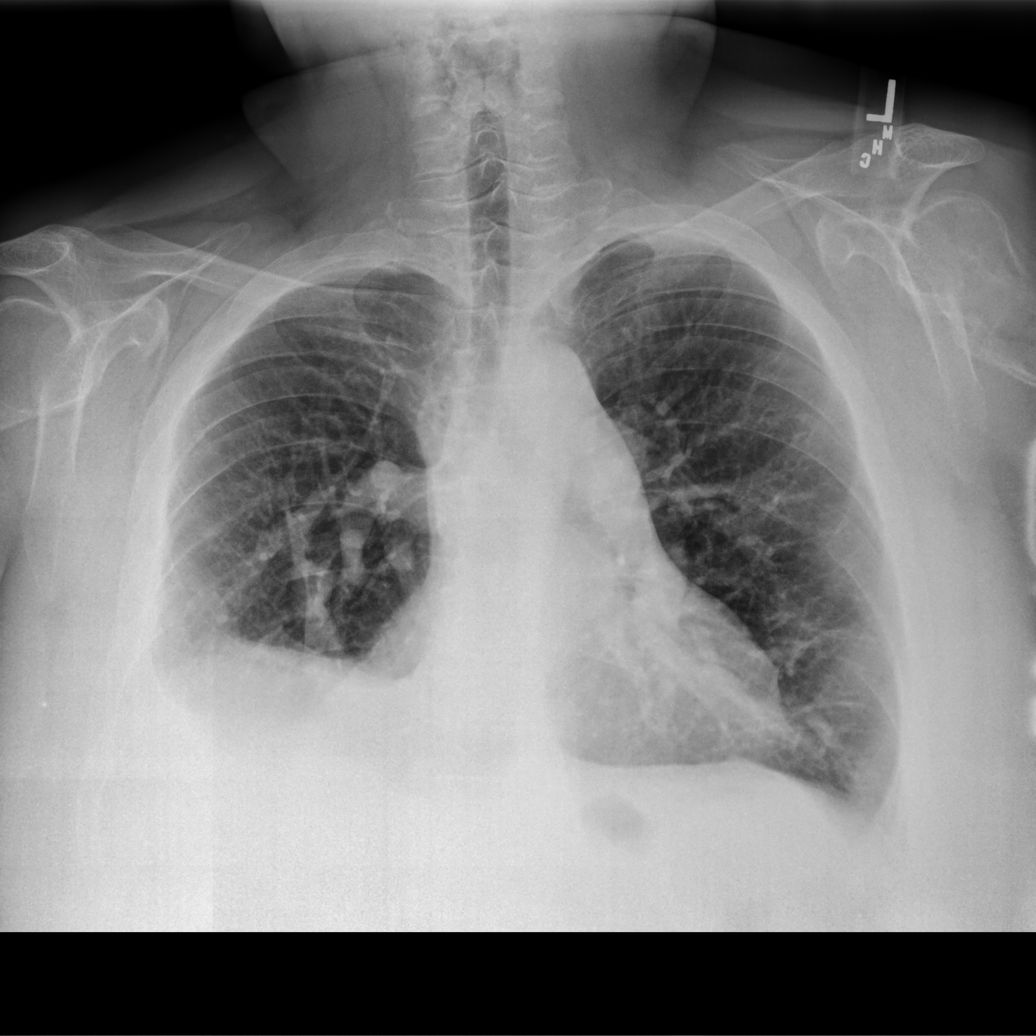

[chest lat (1 of 2)]
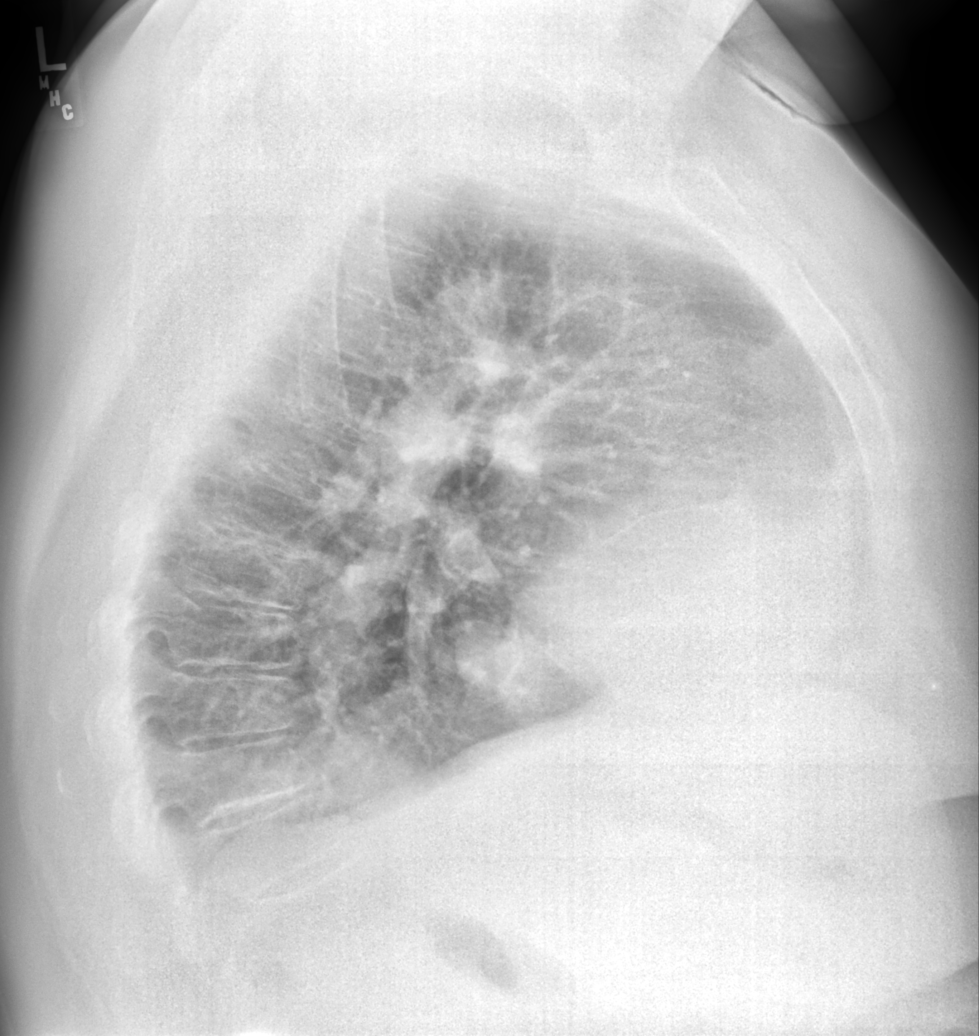

[chest pa (2 of 2)]
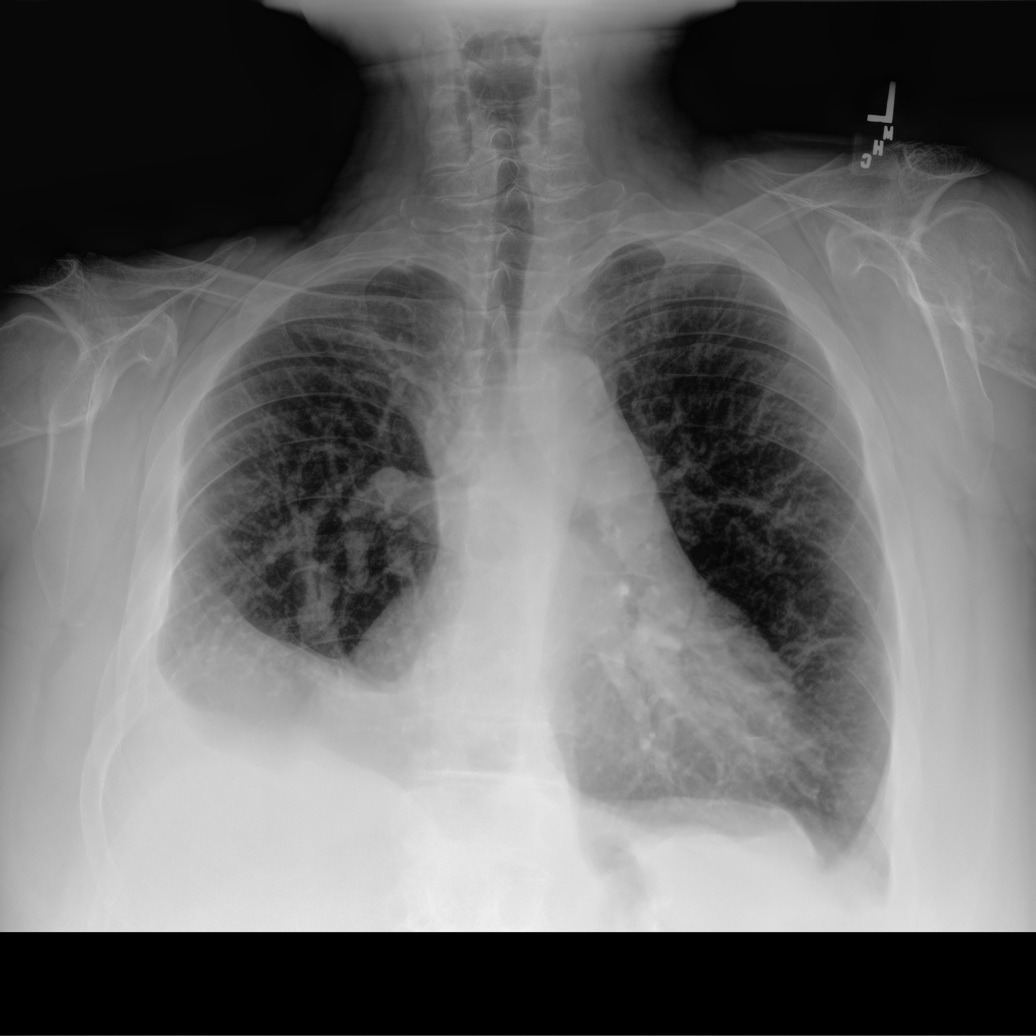

[chest lat (2 of 2)]
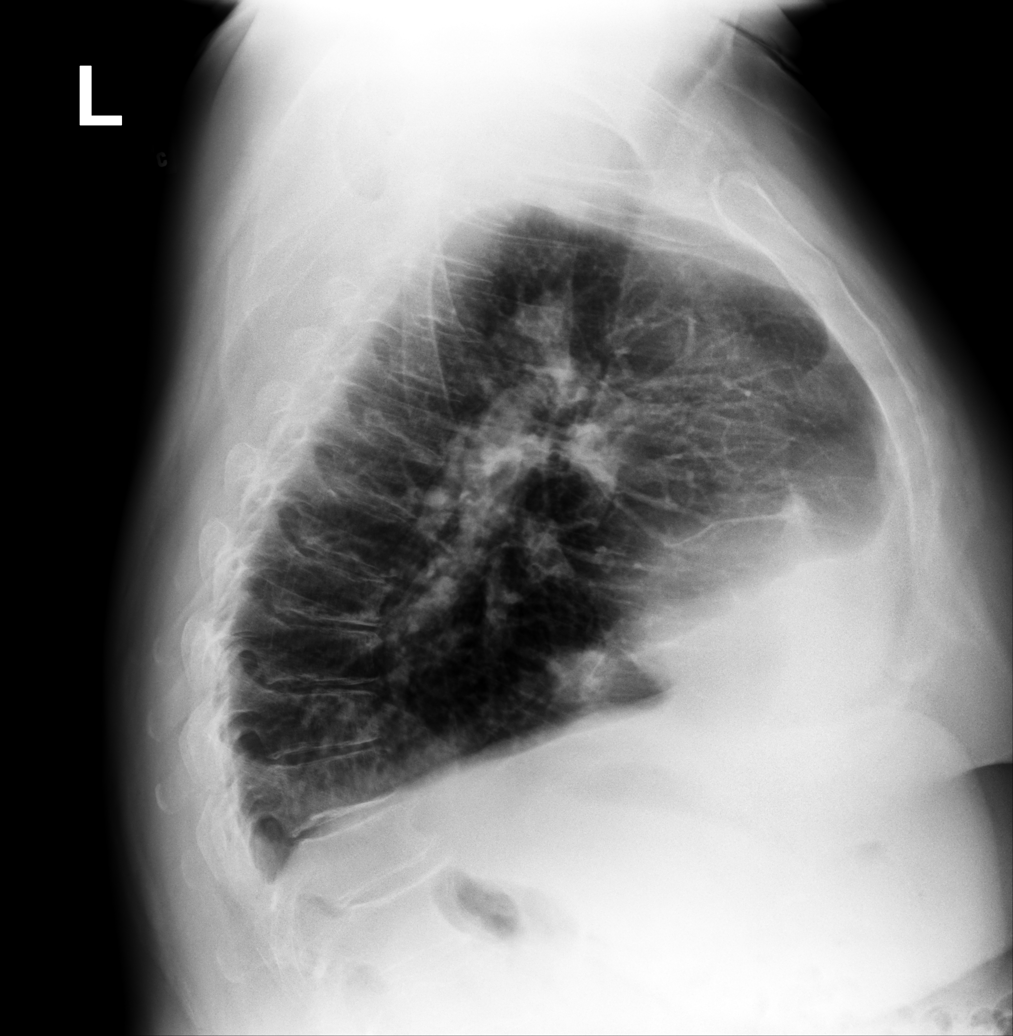

[4 of 4 positions shown; findings below may reference images not displayed]

FINDINGS: Cardiac silhouette and mediastinal contours are within normal
limits. Moderately decreased right lung volume, similar to prior.
The left lung is again clear. There is again chronic blunting of the
right costophrenic angle and right-sided volume loss. There is again
flattening of the diaphragms and hyperinflation. Moderate multilevel
degenerative disc changes of the thoracic spine with mild chronic
anterior height loss of multiple lower thoracic vertebral bodies.
IMPRESSION: No definite acute lung process. Chronic right lung volume loss and
blunting of the right costophrenic angle suggesting pleural
thickening/scarring.

## 2023-01-01 ENCOUNTER — Other Ambulatory Visit: Payer: Self-pay | Admitting: Family Medicine

## 2023-01-01 DIAGNOSIS — Z1231 Encounter for screening mammogram for malignant neoplasm of breast: Secondary | ICD-10-CM

## 2023-07-14 ENCOUNTER — Encounter: Payer: Self-pay | Admitting: Pulmonary Disease

## 2023-07-14 ENCOUNTER — Ambulatory Visit
Admission: RE | Admit: 2023-07-14 | Discharge: 2023-07-14 | Disposition: A | Discharge status: Home / Self Care | Source: Ambulatory Visit | Attending: Pulmonary Disease

## 2023-07-14 ENCOUNTER — Ambulatory Visit
Admission: RE | Admit: 2023-07-14 | Discharge: 2023-07-14 | Disposition: A | Discharge status: Home / Self Care | Source: Ambulatory Visit | Attending: Pulmonary Disease | Admitting: Pulmonary Disease

## 2023-07-14 ENCOUNTER — Ambulatory Visit: Payer: Self-pay | Admitting: Pulmonary Disease

## 2023-07-14 VITALS — BP 140/82 | HR 86 | Temp 96.8°F | Ht 66.0 in | Wt 306.6 lb

## 2023-07-14 DIAGNOSIS — R0609 Other forms of dyspnea: Secondary | ICD-10-CM

## 2023-07-14 DIAGNOSIS — R918 Other nonspecific abnormal finding of lung field: Secondary | ICD-10-CM | POA: Diagnosis not present

## 2023-07-14 DIAGNOSIS — J4540 Moderate persistent asthma, uncomplicated: Secondary | ICD-10-CM

## 2023-07-14 DIAGNOSIS — R0602 Shortness of breath: Secondary | ICD-10-CM | POA: Diagnosis not present

## 2023-07-14 DIAGNOSIS — J984 Other disorders of lung: Secondary | ICD-10-CM | POA: Diagnosis not present

## 2023-07-14 LAB — NITRIC OXIDE: Nitric Oxide: 12

## 2023-07-14 MED ORDER — ALBUTEROL SULFATE HFA 108 (90 BASE) MCG/ACT IN AERS: 2.0000 | INHALATION_SPRAY | Freq: Four times a day (QID) | RESPIRATORY_TRACT | 2 refills | Status: AC | PRN

## 2023-07-14 MED ORDER — BUDESONIDE-FORMOTEROL FUMARATE 160-4.5 MCG/ACT IN AERO: 2.0000 | INHALATION_SPRAY | Freq: Two times a day (BID) | RESPIRATORY_TRACT | 12 refills | Status: DC

## 2023-07-14 MED ORDER — ALBUTEROL SULFATE (2.5 MG/3ML) 0.083% IN NEBU: 2.5000 mg | INHALATION_SOLUTION | Freq: Four times a day (QID) | RESPIRATORY_TRACT | 12 refills | Status: DC | PRN

## 2023-07-15 NOTE — Progress Notes (Signed)
 Synopsis: Referred in by No ref. provider found   Subjective:   PATIENT ID: Anna [name] GENDER: female DOB: 05-09-1976, MRN: [id number]  Chief Complaint  Patient presents with   Consult    DOE. No wheezing. Cough with clear sputum.     HPI Ms. Malenfant is a 48 year female patient with a past medical history of Morbid obesity, chronic pain, GERD, gastric bypass in 2009, heavy tobacco use presenting today to the pulmonary clinic for the evaluation of ongoing shortness of breath.  She reports that she started having shortness of breath about a year ago. Describes it as breathing through a straw. Reports that Nebs help her. She does report intermittent cough with sometimes clear mucous.   She was diagnosed with asthma in her mid 63s . She is hypersensitive to strong scents and humidity. She does have allergic rhinitis and is on singulair.   CXR 2023 - Moderately decreased right lung volume similar to prior.  Family history - Father had COPD who was smoker.   Social history - smokes 1ppd for 20 years. Has 4 dogs and 3 cats.   ROS All systems were reviewed and are negative except for the above.  Objective:   Vitals:   07/14/23 1324  BP: (!) 140/82  Pulse: 86  Temp: (!) 96.8 F (36 C)  SpO2: 96%  Weight: (!) 306 lb 9.6 oz (139.1 kg)  Height: 5\' 6"  (1.676 m)   96% on RA BMI Readings from Last 3 Encounters:  07/14/23 49.49 kg/m  08/18/19 44.05 kg/m  01/24/18 44.06 kg/m   Wt Readings from Last 3 Encounters:  07/14/23 (!) 306 lb 9.6 oz (139.1 kg)  08/18/19 272 lb 14.9 oz (123.8 kg)  01/24/18 273 lb (123.8 kg)    Physical Exam GEN: NAD, Morbidly obese  HEENT: Supple Neck, Reactive Pupils, EOMI  CVS: Normal S1, Normal S2, RRR, No murmurs or ES appreciated  Lungs: Clear bilateral air entry.  Abdomen: Soft, non tender, non distended, + BS  Extremities: Warm and well perfused, No edema   Labs and imaging were reviewed.   Ancillary Information   CBC     Component Value Date/Time   WBC 7.2 08/18/2019 0737   RBC 4.45 08/18/2019 0737   HGB 14.5 08/18/2019 0737   HCT 43.3 08/18/2019 0737   PLT 233 08/18/2019 0737   MCV 97.3 08/18/2019 0737   MCH 32.6 08/18/2019 0737   MCHC 33.5 08/18/2019 0737   RDW 11.9 08/18/2019 0737   LYMPHSABS 3.5 05/02/2013 2151   MONOABS 0.7 05/02/2013 2151   EOSABS 0.2 05/02/2013 2151   BASOSABS 0.0 05/02/2013 2151        No data to display           Assessment & Plan:  Ms. Falzon is a 48 year female patient with a past medical history of Morbid obesity, chronic pain, GERD, gastric bypass in 2009, heavy tobacco use presenting today to the pulmonary clinic for the evaluation of ongoing shortness of breath.  #Moderate Persistent Asthma   []  PFTs  []  CBC w/ Diff , Allergen Panel with total IgE  []  Start budesonide-formoterol [Symbicort] 160 2 puffs bid.  []  C/w Albuterol neb and inh as needed.   #Weight gain, shortness of breath   []  TSH, Free T3 T4   Return in about 8 weeks (around 09/08/2023).  I spent 60 minutes caring for this patient today, including preparing to see the patient, obtaining a medical history , reviewing a  separately obtained history, performing a medically appropriate examination and/or evaluation, counseling and educating the patient/family/caregiver, ordering medications, tests, or procedures, documenting clinical information in the electronic health record, and independently interpreting results (not separately reported/billed) and communicating results to the patient/family/caregiver  Janann Colonel, MD [facility] 07/15/2023 5:07 PM

## 2023-08-14 ENCOUNTER — Other Ambulatory Visit (HOSPITAL_BASED_OUTPATIENT_CLINIC_OR_DEPARTMENT_OTHER): Payer: Self-pay

## 2023-08-14 ENCOUNTER — Ambulatory Visit

## 2023-08-14 ENCOUNTER — Other Ambulatory Visit: Payer: Self-pay

## 2023-08-14 DIAGNOSIS — R0609 Other forms of dyspnea: Secondary | ICD-10-CM

## 2023-08-21 ENCOUNTER — Ambulatory Visit: Payer: Self-pay | Admitting: Pulmonary Disease

## 2023-09-25 ENCOUNTER — Encounter: Payer: Self-pay | Admitting: Pulmonary Disease

## 2023-09-25 ENCOUNTER — Ambulatory Visit: Admitting: Pulmonary Disease

## 2023-09-25 VITALS — BP 130/82 | HR 84 | Temp 97.3°F | Ht 67.0 in | Wt 306.0 lb

## 2023-09-25 VITALS — HR 81 | Ht 67.0 in | Wt 306.0 lb

## 2023-09-25 DIAGNOSIS — J439 Emphysema, unspecified: Secondary | ICD-10-CM

## 2023-09-25 DIAGNOSIS — R0609 Other forms of dyspnea: Secondary | ICD-10-CM | POA: Diagnosis not present

## 2023-09-25 DIAGNOSIS — J4489 Other specified chronic obstructive pulmonary disease: Secondary | ICD-10-CM

## 2023-09-25 LAB — PULMONARY FUNCTION TEST
DL/VA % pred: 140 %
DL/VA: 5.93 ml/min/mmHg/L
DLCO unc % pred: 115 %
DLCO unc: 27.03 ml/min/mmHg
FEF 25-75 Post: 0.53 L/s
FEF 25-75 Pre: 0.45 L/s
FEF2575-%Change-Post: 19 %
FEF2575-%Pred-Post: 17 %
FEF2575-%Pred-Pre: 14 %
FEV1-%Change-Post: 7 %
FEV1-%Pred-Post: 33 %
FEV1-%Pred-Pre: 31 %
FEV1-Post: 1.06 L
FEV1-Pre: 0.98 L
FEV1FVC-%Change-Post: 6 %
FEV1FVC-%Pred-Pre: 57 %
FEV6-%Change-Post: 2 %
FEV6-%Pred-Post: 54 %
FEV6-%Pred-Pre: 52 %
FEV6-Post: 2.09 L
FEV6-Pre: 2.04 L
FEV6FVC-%Change-Post: 0 %
FEV6FVC-%Pred-Post: 100 %
FEV6FVC-%Pred-Pre: 99 %
FVC-%Change-Post: 0 %
FVC-%Pred-Post: 54 %
FVC-%Pred-Pre: 53 %
FVC-Post: 2.14 L
FVC-Pre: 2.12 L
Post FEV1/FVC ratio: 49 %
Post FEV6/FVC ratio: 98 %
Pre FEV1/FVC ratio: 46 %
Pre FEV6/FVC Ratio: 97 %
RV % pred: 190 %
RV: 3.62 L
TLC % pred: 110 %
TLC: 6.11 L

## 2023-09-25 MED ORDER — ALBUTEROL SULFATE HFA 108 (90 BASE) MCG/ACT IN AERS: 2.0000 | INHALATION_SPRAY | Freq: Four times a day (QID) | RESPIRATORY_TRACT | 2 refills | Status: DC | PRN

## 2023-09-25 MED ORDER — TRELEGY ELLIPTA 200-62.5-25 MCG/ACT IN AEPB: 1.0000 | INHALATION_SPRAY | Freq: Every day | RESPIRATORY_TRACT | 6 refills | Status: AC

## 2023-09-25 MED ORDER — TRELEGY ELLIPTA 200-62.5-25 MCG/ACT IN AEPB: 1.0000 | INHALATION_SPRAY | Freq: Every day | RESPIRATORY_TRACT | 0 refills | Status: AC

## 2023-09-25 NOTE — Progress Notes (Signed)
 Full PFT completed today ? ?

## 2023-09-25 NOTE — Progress Notes (Addendum)
 Synopsis: Referred in by Annitta Kindler, MD   Subjective:   PATIENT ID: Anna [name] GENDER: female DOB: 1975-12-25, MRN: [id number]  No chief complaint on file.   HPI Anna [name] is a 48 year female patient with a past medical history of Morbid obesity, chronic pain, GERD, gastric bypass in 2009, heavy tobacco use presenting today to the pulmonary clinic for the evaluation of ongoing shortness of breath.  She reports that she started having shortness of breath about a year ago. Describes it as breathing through a straw. Reports that Nebs help her. She does report intermittent cough with sometimes clear mucous.   She was diagnosed with asthma in her mid 36s . She is hypersensitive to strong scents and humidity. She does have allergic rhinitis and is on singulair.   CXR 2023 - Moderately decreased right lung volume similar to prior.  Family history - Father had COPD who was smoker.   Social history - smokes 1ppd for 20 years. Has 4 dogs and 3 cats.   09/25/2023 OV - Started her on Symbicort  during the last visit, however she does not feel any different, still with shortness of breath on exertion mostly.   PFTs with severe obstructive defect with FEV-1 of 31% of predicted, air trapping and normal DLCO.   ROS All systems were reviewed and are negative except for the above.  Objective:   There were no vitals filed for this visit.    on RA BMI Readings from Last 3 Encounters:  09/25/23 47.93 kg/m  07/14/23 49.49 kg/m  08/18/19 44.05 kg/m   Wt Readings from Last 3 Encounters:  09/25/23 (!) 306 lb (138.8 kg)  07/14/23 (!) 306 lb 9.6 oz (139.1 kg)  08/18/19 272 lb 14.9 oz (123.8 kg)    Physical Exam GEN: NAD, Morbidly obese  HEENT: Supple Neck, Reactive Pupils, EOMI  CVS: Normal S1, Normal S2, RRR, No murmurs or ES appreciated  Lungs: Expiratory wheezing heard best over the left hemithorax.   Abdomen: Soft, non tender, non distended, + BS  Extremities: Warm  and well perfused, No edema   Labs and imaging were reviewed.   Ancillary Information   CBC    Component Value Date/Time   WBC 7.2 08/18/2019 0737   RBC 4.45 08/18/2019 0737   HGB 14.5 08/18/2019 0737   HCT 43.3 08/18/2019 0737   PLT 233 08/18/2019 0737   MCV 97.3 08/18/2019 0737   MCH 32.6 08/18/2019 0737   MCHC 33.5 08/18/2019 0737   RDW 11.9 08/18/2019 0737   LYMPHSABS 3.5 05/02/2013 2151   MONOABS 0.7 05/02/2013 2151   EOSABS 0.2 05/02/2013 2151   BASOSABS 0.0 05/02/2013 2151       Latest Ref Rng & Units 08/14/2023   12:50 PM  PFT Results  FVC-Pre L 2.12  P  FVC-Predicted Pre % 53  P  FVC-Post L 2.14  P  FVC-Predicted Post % 54  P  Pre FEV1/FVC % % 46  P  Post FEV1/FCV % % 49  P  FEV1-Pre L 0.98  P  FEV1-Predicted Pre % 31  P  FEV1-Post L 1.06  P  DLCO uncorrected ml/min/mmHg 27.03  P  DLCO UNC% % 115  P  DLVA Predicted % 140  P  TLC L 6.11  P  TLC % Predicted % 110  P  RV % Predicted % 190  P    P Preliminary result     Assessment & Plan:  Anna [name] is a 48  year female patient with a past medical history of Morbid obesity, chronic pain, GERD, gastric bypass in 2009, heavy tobacco use presenting today to the pulmonary clinic for the evaluation of ongoing shortness of breath.  #Moderate Persistent Asthma  PFTs 09/25/2023 with severe obstructive defect FEV-1 31% of predicted, airtrapping and normal DLCO.   []  CBC w/ Diff , Allergen Panel with total IgE  []  Start fluticasone-Vilanterol-Umecledinium [Trelegy Ellipta] 200 1 puff daily.  []  C/w Albuterol  neb and inh as needed.  []  Will discuss escalating therapy on the next visit.  []  Will obtain a CT chest wo contrast to evaluate for parenchymal lung disease given unilateral wheezing, smoking history and strong family history of lung cancer with abnormal CXR.   RTC 4 months.   I spent 30 minutes caring for this patient today, including preparing to see the patient, obtaining a medical history , reviewing a  separately obtained history, performing a medically appropriate examination and/or evaluation, counseling and educating the patient/family/caregiver, ordering medications, tests, or procedures, documenting clinical information in the electronic health record, and independently interpreting results (not separately reported/billed) and communicating results to the patient/family/caregiver  Annitta Kindler, MD [facility] 09/25/2023 2:41 PM

## 2023-09-25 NOTE — Patient Instructions (Signed)
 Full PFT completed today ? ?

## 2023-09-26 ENCOUNTER — Telehealth: Payer: Self-pay

## 2023-09-26 ENCOUNTER — Other Ambulatory Visit (HOSPITAL_COMMUNITY): Payer: Self-pay

## 2023-09-26 NOTE — Telephone Encounter (Signed)
*  Pulm  Pharmacy Patient Advocate Encounter   Received notification from CoverMyMeds that prior authorization for Trelegy Ellipta 200-62.5-25MCG/ACT aerosol powder  is required/requested.   Insurance verification completed.   The patient is insured through [facility] .   Per test claim: The current 30 day co-pay is, $70.00.  No PA needed at this time. This test claim was processed through [facility]- copay amounts may vary at other pharmacies due to pharmacy/plan contracts, or as the patient moves through the different stages of their insurance plan.

## 2023-09-30 ENCOUNTER — Ambulatory Visit: Payer: Self-pay | Admitting: Pulmonary Disease

## 2023-10-13 ENCOUNTER — Other Ambulatory Visit: Payer: Self-pay

## 2023-10-13 DIAGNOSIS — R0609 Other forms of dyspnea: Secondary | ICD-10-CM

## 2023-10-13 MED ORDER — ALBUTEROL SULFATE (2.5 MG/3ML) 0.083% IN NEBU: 2.5000 mg | INHALATION_SOLUTION | Freq: Four times a day (QID) | RESPIRATORY_TRACT | 12 refills | Status: DC | PRN

## 2023-10-13 NOTE — Progress Notes (Signed)
 Patient called in asking for a refill on her Proventil .  Proventil  has been sent in.  Nothing further needed.

## 2023-10-27 ENCOUNTER — Encounter: Attending: Pulmonary Disease

## 2023-10-27 ENCOUNTER — Other Ambulatory Visit: Payer: Self-pay

## 2023-10-27 DIAGNOSIS — J40 Bronchitis, not specified as acute or chronic: Secondary | ICD-10-CM | POA: Insufficient documentation

## 2023-10-27 DIAGNOSIS — J4489 Other specified chronic obstructive pulmonary disease: Secondary | ICD-10-CM

## 2023-10-27 DIAGNOSIS — J439 Emphysema, unspecified: Secondary | ICD-10-CM | POA: Insufficient documentation

## 2023-10-27 NOTE — Progress Notes (Signed)
 Virtual Visit completed. Patient informed on EP and RD appointment and 6 Minute walk test. Patient also informed of patient health questionnaires on My Chart. Patient Verbalizes understanding. Visit diagnosis can be found in [facility] 09/25/2023.

## 2023-10-28 ENCOUNTER — Encounter

## 2023-10-28 VITALS — Ht 67.4 in | Wt 284.5 lb

## 2023-10-28 DIAGNOSIS — J439 Emphysema, unspecified: Secondary | ICD-10-CM | POA: Diagnosis not present

## 2023-10-28 DIAGNOSIS — J40 Bronchitis, not specified as acute or chronic: Secondary | ICD-10-CM | POA: Diagnosis not present

## 2023-10-28 NOTE — Patient Instructions (Signed)
 Patient Instructions  Patient Details  Name: Anna [name] MRN: [id number] Date of Birth: 09/10/1975 Referring Provider:  Annitta Kindler, MD  Below are your personal goals for exercise, nutrition, and risk factors. Our goal is to help you stay on track towards obtaining and maintaining these goals. We will be discussing your progress on these goals with you throughout the program.  Initial Exercise Prescription:  Initial Exercise Prescription - 10/28/23 1500       Date of Initial Exercise RX and Referring Provider   Date 10/28/23    Referring Provider Dr. Annitta Kindler      Oxygen   Maintain Oxygen Saturation 88% or higher      Treadmill   MPH 0.8    Grade 0    Minutes 15    METs 2.14      Recumbant Bike   Level 1    RPM 50    Watts 25    Minutes 15    METs 2.14      NuStep   Level 1    SPM 80    Minutes 15    METs 2.14      REL-XR   Level 1    Watts 25    Speed 50    Minutes 15    METs 2.14      Biostep-RELP   Level 1    SPM 50    Minutes 15    METs 2.14      Track   Laps 15    Minutes 15    METs 2.14      Intensity   THRR 40-80% of Max Heartrate 116-153    Ratings of Perceived Exertion 11-13    Perceived Dyspnea 0-4      Progression   Progression Continue to progress workloads to maintain intensity without signs/symptoms of physical distress.      Resistance Training   Training Prescription Yes    Weight 5lb    Reps 10-15          Exercise Goals: Frequency: Be able to perform aerobic exercise two to three times per week in program working toward 2-5 days per week of home exercise.  Intensity: Work with a perceived exertion of 11 (fairly light) - 15 (hard) while following your exercise prescription.  We will make changes to your prescription with you as you progress through the program.   Duration: Be able to do 30 to 45 minutes of continuous aerobic exercise in addition to a 5 minute warm-up and a 5 minute cool-down  routine.   Nutrition Goals: Your personal nutrition goals will be established when you do your nutrition analysis with the dietician.  The following are general nutrition guidelines to follow: Cholesterol < 200mg /day Sodium < 1500mg /day Fiber: Women under 50 yrs - 25 grams per day  Personal Goals:  Personal Goals and Risk Factors at Admission - 10/27/23 1347       Core Components/Risk Factors/Patient Goals on Admission    Weight Management Yes;Weight Loss    Intervention Weight Management: Develop a combined nutrition and exercise program designed to reach desired caloric intake, while maintaining appropriate intake of nutrient and fiber, sodium and fats, and appropriate energy expenditure required for the weight goal.;Weight Management: Provide education and appropriate resources to help participant work on and attain dietary goals.;Weight Management/Obesity: Establish reasonable short term and long term weight goals.;Obesity: Provide education and appropriate resources to help participant work on and attain dietary goals.    Expected  Outcomes Short Term: Continue to assess and modify interventions until short term weight is achieved;Weight Loss: Understanding of general recommendations for a balanced deficit meal plan, which promotes 1-2 lb weight loss per week and includes a negative energy balance of [phone] kcal/d;Understanding of distribution of calorie intake throughout the day with the consumption of 4-5 meals/snacks;Understanding recommendations for meals to include 15-35% energy as protein, 25-35% energy from fat, 35-60% energy from carbohydrates, less than 200mg  of dietary cholesterol, 20-35 gm of total fiber daily    Improve shortness of breath with ADL's Yes    Intervention Provide education, individualized exercise plan and daily activity instruction to help decrease symptoms of SOB with activities of daily living.    Expected Outcomes Short Term: Improve cardiorespiratory fitness  to achieve a reduction of symptoms when performing ADLs;Long Term: Be able to perform more ADLs without symptoms or delay the onset of symptoms    Hypertension Yes    Intervention Provide education on lifestyle modifcations including regular physical activity/exercise, weight management, moderate sodium restriction and increased consumption of fresh fruit, vegetables, and low fat dairy, alcohol moderation, and smoking cessation.;Monitor prescription use compliance.    Expected Outcomes Short Term: Continued assessment and intervention until BP is < 140/70mm HG in hypertensive participants. < 130/59mm HG in hypertensive participants with diabetes, heart failure or chronic kidney disease.;Long Term: Maintenance of blood pressure at goal levels.          Exercise Goals and Review:  Exercise Goals     Row Name 10/28/23 1522             Exercise Goals   Increase Physical Activity Yes       Intervention Provide advice, education, support and counseling about physical activity/exercise needs.;Develop an individualized exercise prescription for aerobic and resistive training based on initial evaluation findings, risk stratification, comorbidities and participant's personal goals.       Expected Outcomes Short Term: Attend rehab on a regular basis to increase amount of physical activity.;Long Term: Add in home exercise to make exercise part of routine and to increase amount of physical activity.;Long Term: Exercising regularly at least 3-5 days a week.       Increase Strength and Stamina Yes       Intervention Provide advice, education, support and counseling about physical activity/exercise needs.;Develop an individualized exercise prescription for aerobic and resistive training based on initial evaluation findings, risk stratification, comorbidities and participant's personal goals.       Expected Outcomes Short Term: Increase workloads from initial exercise prescription for resistance, speed, and  METs.;Short Term: Perform resistance training exercises routinely during rehab and add in resistance training at home;Long Term: Improve cardiorespiratory fitness, muscular endurance and strength as measured by increased METs and functional capacity ( )       Able to understand and use rate of perceived exertion (RPE) scale Yes       Intervention Provide education and explanation on how to use RPE scale       Expected Outcomes Short Term: Able to use RPE daily in rehab to express subjective intensity level;Long Term:  Able to use RPE to guide intensity level when exercising independently       Able to understand and use Dyspnea scale Yes       Intervention Provide education and explanation on how to use Dyspnea scale       Expected Outcomes Short Term: Able to use Dyspnea scale daily in rehab to express subjective sense of shortness of breath  during exertion;Long Term: Able to use Dyspnea scale to guide intensity level when exercising independently       Knowledge and understanding of Target Heart Rate Range (THRR) Yes       Intervention Provide education and explanation of THRR including how the numbers were predicted and where they are located for reference       Expected Outcomes Short Term: Able to state/look up THRR;Short Term: Able to use daily as guideline for intensity in rehab;Long Term: Able to use THRR to govern intensity when exercising independently       Able to check pulse independently Yes       Intervention Provide education and demonstration on how to check pulse in carotid and radial arteries.;Review the importance of being able to check your own pulse for safety during independent exercise       Expected Outcomes Short Term: Able to explain why pulse checking is important during independent exercise;Long Term: Able to check pulse independently and accurately       Understanding of Exercise Prescription Yes       Intervention Provide education, explanation, and written materials  on patient's individual exercise prescription       Expected Outcomes Short Term: Able to explain program exercise prescription;Long Term: Able to explain home exercise prescription to exercise independently          Copy of goals given to participant.

## 2023-10-28 NOTE — Progress Notes (Signed)
 Pulmonary Individual Treatment Plan  Patient Details  Name: Anna [name] MRN: [id number] Date of Birth: Aug 09, 1975 Referring Provider:   Flowsheet Row Pulmonary Rehab from 10/28/2023 in [facility] Cardiac and Pulmonary Rehab  Referring Provider Dr. Annitta Kindler    Initial Encounter Date:  Flowsheet Row Pulmonary Rehab from 10/28/2023 in [facility] Cardiac and Pulmonary Rehab  Date 10/28/23    Visit Diagnosis: COPD with chronic bronchitis and emphysema (HCC)  Patient's Home Medications on Admission:  Current Outpatient Medications:    albuterol  (PROAIR  HFA) 108 (90 BASE) MCG/ACT inhaler, Inhale 2 puffs into the lungs 4 (four) times daily., Disp: 1 Inhaler, Rfl: 11   albuterol  (PROVENTIL ) (2.5 MG/3ML) 0.083% nebulizer solution, Take 3 mLs (2.5 mg total) by nebulization as directed. (Patient not taking: Reported on 10/27/2023), Disp: 75 mL, Rfl: 11   albuterol  (PROVENTIL ) (2.5 MG/3ML) 0.083% nebulizer solution, Take 3 mLs (2.5 mg total) by nebulization every 6 (six) hours as needed for wheezing or shortness of breath., Disp: 75 mL, Rfl: 12   albuterol  (VENTOLIN  HFA) 108 (90 Base) MCG/ACT inhaler, Inhale 2 puffs into the lungs every 6 (six) hours as needed for wheezing or shortness of breath. (Patient not taking: Reported on 10/27/2023), Disp: 8 g, Rfl: 2   albuterol  (VENTOLIN  HFA) 108 (90 Base) MCG/ACT inhaler, Inhale 2 puffs into the lungs every 6 (six) hours as needed for wheezing or shortness of breath. (Patient not taking: Reported on 10/27/2023), Disp: 8 g, Rfl: 2   Biotin 1000 MCG tablet, Take 1,000 mcg by mouth daily.   (Patient not taking: Reported on 09/25/2023), Disp: , Rfl:    brimonidine (ALPHAGAN P) 0.1 % SOLN, as directed.   (Patient not taking: Reported on 09/25/2023), Disp: , Rfl:    brinzolamide (AZOPT) 1 % ophthalmic suspension, 1 drop as directed.   (Patient not taking: Reported on 09/25/2023), Disp: , Rfl:    Calcium-Vitamin D-Vitamin K ([employer] CALCIUM CHEWS) 500-200-40 MG-UNT-MCG  CHEW, Chew by mouth daily.   (Patient not taking: Reported on 09/25/2023), Disp: , Rfl:    chlorpheniramine-HYDROcodone  (TUSSIONEX PENNKINETIC ER) 10-8 MG/5ML LQCR, Take 5 mLs by mouth every 12 (twelve) hours. (Patient not taking: Reported on 09/25/2023), Disp: 115 mL, Rfl: 0   chlorpheniramine-HYDROcodone  (TUSSIONEX) 10-8 MG/5ML LQCR, Take 5 mLs by mouth every 12 (twelve) hours as needed. (Patient not taking: Reported on 09/25/2023), Disp: 140 mL, Rfl: 0   cyclobenzaprine  (FLEXERIL ) 5 MG tablet, Take 1 tablet (5 mg total) by mouth at bedtime as needed for muscle spasms. (Patient not taking: Reported on 09/25/2023), Disp: 12 tablet, Rfl: 0   Diphenhydramine-APAP, sleep, (TYLENOL  PM EXTRA STRENGTH PO), Take by mouth.   (Patient not taking: Reported on 09/25/2023), Disp: , Rfl:    Fluticasone-Umeclidin-Vilant (TRELEGY ELLIPTA ) 200-62.5-25 MCG/ACT AEPB, Inhale 1 puff into the lungs daily., Disp: 3 each, Rfl: 6   Fluticasone-Umeclidin-Vilant (TRELEGY ELLIPTA ) 200-62.5-25 MCG/ACT AEPB, Inhale 1 puff into the lungs daily., Disp: 28 each, Rfl: 0   HYDROcodone -acetaminophen  (NORCO/VICODIN) 5-325 MG tablet, Take 1-2 tablets by mouth every 4 (four) hours as needed. (Patient not taking: Reported on 09/25/2023), Disp: 20 tablet, Rfl: 0   loratadine (CLARITIN) 10 MG tablet, Take 10 mg by mouth daily.   (Patient not taking: Reported on 09/25/2023), Disp: , Rfl:    metoprolol  tartrate (LOPRESSOR ) 25 MG tablet, Take 1 tablet (25 mg total) by mouth 2 (two) times daily., Disp: 60 tablet, Rfl: 0   montelukast (SINGULAIR) 10 MG tablet, Take 10 mg by mouth at bedtime.  ,  Disp: , Rfl:    Multiple Vitamins-Iron (MULTI-VITAMIN/IRON) TABS, Take 1 tablet by mouth 2 (two) times daily. Flintstones chew  (Patient not taking: Reported on 09/25/2023), Disp: , Rfl:    ondansetron  (ZOFRAN ) 4 MG tablet, Take 1 tablet (4 mg total) by mouth every 8 (eight) hours as needed for nausea or vomiting. (Patient not taking: Reported on 09/25/2023), Disp:  12 tablet, Rfl: 0   oxyCODONE  (OXY IR/ROXICODONE ) 5 MG immediate release tablet, Take 1 tablet (5 mg total) by mouth every 4 (four) hours as needed for severe pain. Try not to do more than 4 times a day. (Patient not taking: Reported on 09/25/2023), Disp: 30 tablet, Rfl: 0   oxyCODONE -acetaminophen  (PERCOCET/ROXICET) 5-325 MG tablet, Take 1 tablet by mouth every 6 (six) hours as needed for moderate pain or severe pain. (Patient not taking: Reported on 09/25/2023), Disp: 20 tablet, Rfl: 0   Pseudoeph-Chlorphen-Hydrocod (ZUTRIPRO) 60-4-5 MG/5ML SOLN, Take 5 mLs by mouth 4 (four) times daily as needed. (Patient not taking: Reported on 09/25/2023), Disp: 240 mL, Rfl: 1   traMADol (ULTRAM) 50 MG tablet, Take 50 mg by mouth every 6 (six) hours as needed.   (Patient not taking: Reported on 09/25/2023), Disp: , Rfl:    travoprost, benzalkonium, (TRAVATAN Z) 0.004 % ophthalmic solution, Place 1 drop into both eyes at bedtime.   (Patient not taking: Reported on 09/25/2023), Disp: , Rfl:    varenicline (CHANTIX CONTINUING MONTH PAK) 1 MG tablet, Take 1 mg by mouth as directed.   (Patient not taking: Reported on 09/25/2023), Disp: , Rfl:    vitamin B-12 (CYANOCOBALAMIN) 1000 MCG tablet, Take 1,000 mcg by mouth daily.   (Patient not taking: Reported on 09/25/2023), Disp: , Rfl:    zolpidem  (AMBIEN  CR) 12.5 MG CR tablet, Take 1 tablet (12.5 mg total) by mouth at bedtime as needed for sleep., Disp: 30 tablet, Rfl: 5  Past Medical History: Past Medical History:  Diagnosis Date   Allergy    Asthma    COPD (chronic obstructive pulmonary disease) (HCC)    PFT 07/22/08-FEV1/FVC 0.49   Degenerative disc disease    Edema    Family history of ischemic heart disease    Family history of psychiatric condition    GERD (gastroesophageal reflux disease)    Glaucoma    Obesity, unspecified    Obstructive sleep apnea (adult) (pediatric)    Pneumonia    hosp 2002   Scimitar syndrome    Sleep apnea    NPSG 09/09/07 AHI 8.1/hr  prolong desat   Tobacco use disorder     Tobacco Use: Social History   Tobacco Use  Smoking Status Former   Current packs/day: 0.00   Types: Cigarettes, E-cigarettes   Quit date: 06/25/2010   Years since quitting: 13.3  Smokeless Tobacco Never    Labs: Review Flowsheet  More data may exist      Latest Ref Rng & Units 11/12/2006 03/09/2007 09/07/2007 05/20/2008 08/24/2010  Labs for ITP Cardiac and Pulmonary Rehab  Cholestrol 0 - 200 mg/dL - - - 409  811   LDL (calc) 0 - 99 mg/dL - - - 914  -  Direct LDL mg/dL - - - - 782.9   HDL-C >56.21 mg/dL - - - 30.8  65.78   Trlycerides 0.0 - 149.0 mg/dL - - - 84  46.9   Bicarbonate - 29.2  24.6  - - -  TCO2 - 31  26  29   - -     Pulmonary  Assessment Scores:  Pulmonary Assessment Scores     Row Name 10/28/23 1523         mMRC Score   mMRC Score 3        UCSD: Self-administered rating of dyspnea associated with activities of daily living (ADLs) 6-point scale (0 = not at all to 5 = maximal or unable to do because of breathlessness)  Scoring Scores range from 0 to 120.  Minimally important difference is 5 units  CAT: CAT can identify the health impairment of COPD patients and is better correlated with disease progression.  CAT has a scoring range of zero to 40. The CAT score is classified into four groups of low (less than 10), medium (10 - 20), high (21-30) and very high (31-40) based on the impact level of disease on health status. A CAT score over 10 suggests significant symptoms.  A worsening CAT score could be explained by an exacerbation, poor medication adherence, poor inhaler technique, or progression of COPD or comorbid conditions.  CAT MCID is 2 points  mMRC: mMRC (Modified Medical Research Council) Dyspnea Scale is used to assess the degree of baseline functional disability in patients of respiratory disease due to dyspnea. No minimal important difference is established. A decrease in score of 1 point or greater is  considered a positive change.   Pulmonary Function Assessment:  Pulmonary Function Assessment - 10/27/23 1342       Post Bronchodilator Spirometry Results   FVC% 54 %    FEV1% 33 %    FEV1/FVC Ratio 49      Breath   Shortness of Breath Yes;Limiting activity          Exercise Target Goals: Exercise Program Goal: Individual exercise prescription set using results from initial 6 min walk test and THRR while considering  patient's activity barriers and safety.   Exercise Prescription Goal: Initial exercise prescription builds to 30-45 minutes a day of aerobic activity, 2-3 days per week.  Home exercise guidelines will be given to patient during program as part of exercise prescription that the participant will acknowledge.  Education: Aerobic Exercise: - Group verbal and visual presentation on the components of exercise prescription. Introduces F.I.T.T principle from ACSM for exercise prescriptions.  Reviews F.I.T.T. principles of aerobic exercise including progression. Written material given at graduation.   Education: Resistance Exercise: - Group verbal and visual presentation on the components of exercise prescription. Introduces F.I.T.T principle from ACSM for exercise prescriptions  Reviews F.I.T.T. principles of resistance exercise including progression. Written material given at graduation.    Education: Exercise & Equipment Safety: - Individual verbal instruction and demonstration of equipment use and safety with use of the equipment. Flowsheet Row Pulmonary Rehab from 10/28/2023 in [facility] Cardiac and Pulmonary Rehab  Date 10/28/23  Educator [facility]  Instruction Review Code 1- Verbalizes Understanding    Education: Exercise Physiology & General Exercise Guidelines: - Group verbal and written instruction with models to review the exercise physiology of the cardiovascular system and associated critical values. Provides general exercise guidelines with specific guidelines to those  with heart or lung disease.    Education: Flexibility, Balance, Mind/Body Relaxation: - Group verbal and visual presentation with interactive activity on the components of exercise prescription. Introduces F.I.T.T principle from ACSM for exercise prescriptions. Reviews F.I.T.T. principles of flexibility and balance exercise training including progression. Also discusses the mind body connection.  Reviews various relaxation techniques to help reduce and manage stress (i.e. Deep breathing, progressive muscle relaxation, and visualization). Balance handout  provided to take home. Written material given at graduation.   Activity Barriers & Risk Stratification:  Activity Barriers & Cardiac Risk Stratification - 10/28/23 1517       Activity Barriers & Cardiac Risk Stratification   Activity Barriers Joint Problems;Deconditioning;Muscular Weakness;Shortness of Breath          6 Minute Walk:  6 Minute Walk     Row Name 10/28/23 1514         6 Minute Walk   Phase Initial     Distance 455 feet     Walk Time 6 minutes     # of Rest Breaks 0     MPH 0.86     METS 2.14     RPE 17     Perceived Dyspnea  2     VO2 Peak 7.48     Symptoms Yes (comment)     Comments Left knee pain 7/10     Resting HR 80 bpm     Resting BP 122/82     Resting Oxygen Saturation  96 %     Exercise Oxygen Saturation  during 6 min walk 88 %     Max Ex. HR 125 bpm     Max Ex. BP 156/84     2 Minute Post BP 128/84       Interval HR   1 Minute HR 105     2 Minute HR 106     3 Minute HR 84     4 Minute HR 117     5 Minute HR 125     6 Minute HR 124     2 Minute Post HR 9     Interval Heart Rate? Yes       Interval Oxygen   Interval Oxygen? Yes     Baseline Oxygen Saturation % 96 %     1 Minute Oxygen Saturation % 93 %     1 Minute Liters of Oxygen 0 L     2 Minute Oxygen Saturation % 88 %     2 Minute Liters of Oxygen 0 L     3 Minute Oxygen Saturation % 91 %     3 Minute Liters of Oxygen 0 L     4  Minute Oxygen Saturation % 94 %     4 Minute Liters of Oxygen 0 L     5 Minute Oxygen Saturation % 94 %     5 Minute Liters of Oxygen 0 L     6 Minute Oxygen Saturation % 92 %     6 Minute Liters of Oxygen 0 L     2 Minute Post Oxygen Saturation % 93 %     2 Minute Post Liters of Oxygen 0 L       Oxygen Initial Assessment:  Oxygen Initial Assessment - 10/28/23 1523       Home Oxygen   Home Oxygen Device None    Sleep Oxygen Prescription None    Home Exercise Oxygen Prescription None    Home Resting Oxygen Prescription None      Initial 6 min Walk   Oxygen Used None      Program Oxygen Prescription   Program Oxygen Prescription None      Intervention   Short Term Goals To learn and understand importance of monitoring SPO2 with pulse oximeter and demonstrate accurate use of the pulse oximeter.;To learn and demonstrate proper pursed lip breathing techniques or other breathing techniques. ;To learn  and exhibit compliance with exercise, home and travel O2 prescription;To learn and understand importance of maintaining oxygen saturations>88%;To learn and demonstrate proper use of respiratory medications    Long  Term Goals Exhibits compliance with exercise, home  and travel O2 prescription;Compliance with respiratory medication;Maintenance of O2 saturations>88%;Exhibits proper breathing techniques, such as pursed lip breathing or other method taught during program session;Verbalizes importance of monitoring SPO2 with pulse oximeter and return demonstration;Demonstrates proper use of MDI's          Oxygen Re-Evaluation:   Oxygen Discharge (Final Oxygen Re-Evaluation):   Initial Exercise Prescription:  Initial Exercise Prescription - 10/28/23 1500       Date of Initial Exercise RX and Referring Provider   Date 10/28/23    Referring Provider Dr. Annitta Kindler      Oxygen   Maintain Oxygen Saturation 88% or higher      Treadmill   MPH 0.8    Grade 0    Minutes 15     METs 2.14      Recumbant Bike   Level 1    RPM 50    Watts 25    Minutes 15    METs 2.14      NuStep   Level 1    SPM 80    Minutes 15    METs 2.14      REL-XR   Level 1    Watts 25    Speed 50    Minutes 15    METs 2.14      Biostep-RELP   Level 1    SPM 50    Minutes 15    METs 2.14      Track   Laps 15    Minutes 15    METs 2.14      Intensity   THRR 40-80% of Max Heartrate 116-153    Ratings of Perceived Exertion 11-13    Perceived Dyspnea 0-4      Progression   Progression Continue to progress workloads to maintain intensity without signs/symptoms of physical distress.      Resistance Training   Training Prescription Yes    Weight 5lb    Reps 10-15          Perform Capillary Blood Glucose checks as needed.  Exercise Prescription Changes:   Exercise Prescription Changes     Row Name 10/28/23 1500             Response to Exercise   Blood Pressure (Admit) 122/82       Blood Pressure (Exercise) 156/84       Blood Pressure (Exit) 128/84       Heart Rate (Admit) 80 bpm       Heart Rate (Exercise) 125 bpm       Heart Rate (Exit) 90 bpm       Oxygen Saturation (Admit) 96 %       Oxygen Saturation (Exercise) 88 %       Oxygen Saturation (Exit) 93 %       Rating of Perceived Exertion (Exercise) 17       Perceived Dyspnea (Exercise) 3       Symptoms Left knee pain 7/10       Comments results          Exercise Comments:   Exercise Goals and Review:   Exercise Goals     Row Name 10/28/23 1522             Exercise  Goals   Increase Physical Activity Yes       Intervention Provide advice, education, support and counseling about physical activity/exercise needs.;Develop an individualized exercise prescription for aerobic and resistive training based on initial evaluation findings, risk stratification, comorbidities and participant's personal goals.       Expected Outcomes Short Term: Attend rehab on a regular basis to increase  amount of physical activity.;Long Term: Add in home exercise to make exercise part of routine and to increase amount of physical activity.;Long Term: Exercising regularly at least 3-5 days a week.       Increase Strength and Stamina Yes       Intervention Provide advice, education, support and counseling about physical activity/exercise needs.;Develop an individualized exercise prescription for aerobic and resistive training based on initial evaluation findings, risk stratification, comorbidities and participant's personal goals.       Expected Outcomes Short Term: Increase workloads from initial exercise prescription for resistance, speed, and METs.;Short Term: Perform resistance training exercises routinely during rehab and add in resistance training at home;Long Term: Improve cardiorespiratory fitness, muscular endurance and strength as measured by increased METs and functional capacity ( )       Able to understand and use rate of perceived exertion (RPE) scale Yes       Intervention Provide education and explanation on how to use RPE scale       Expected Outcomes Short Term: Able to use RPE daily in rehab to express subjective intensity level;Long Term:  Able to use RPE to guide intensity level when exercising independently       Able to understand and use Dyspnea scale Yes       Intervention Provide education and explanation on how to use Dyspnea scale       Expected Outcomes Short Term: Able to use Dyspnea scale daily in rehab to express subjective sense of shortness of breath during exertion;Long Term: Able to use Dyspnea scale to guide intensity level when exercising independently       Knowledge and understanding of Target Heart Rate Range (THRR) Yes       Intervention Provide education and explanation of THRR including how the numbers were predicted and where they are located for reference       Expected Outcomes Short Term: Able to state/look up THRR;Short Term: Able to use daily as  guideline for intensity in rehab;Long Term: Able to use THRR to govern intensity when exercising independently       Able to check pulse independently Yes       Intervention Provide education and demonstration on how to check pulse in carotid and radial arteries.;Review the importance of being able to check your own pulse for safety during independent exercise       Expected Outcomes Short Term: Able to explain why pulse checking is important during independent exercise;Long Term: Able to check pulse independently and accurately       Understanding of Exercise Prescription Yes       Intervention Provide education, explanation, and written materials on patient's individual exercise prescription       Expected Outcomes Short Term: Able to explain program exercise prescription;Long Term: Able to explain home exercise prescription to exercise independently          Exercise Goals Re-Evaluation :   Discharge Exercise Prescription (Final Exercise Prescription Changes):  Exercise Prescription Changes - 10/28/23 1500       Response to Exercise   Blood Pressure (Admit) 122/82    Blood Pressure (  Exercise) 156/84    Blood Pressure (Exit) 128/84    Heart Rate (Admit) 80 bpm    Heart Rate (Exercise) 125 bpm    Heart Rate (Exit) 90 bpm    Oxygen Saturation (Admit) 96 %    Oxygen Saturation (Exercise) 88 %    Oxygen Saturation (Exit) 93 %    Rating of Perceived Exertion (Exercise) 17    Perceived Dyspnea (Exercise) 3    Symptoms Left knee pain 7/10    Comments results          Nutrition:  Target Goals: Understanding of nutrition guidelines, daily intake of sodium 1500mg , cholesterol 200mg , calories 30% from fat and 7% or less from saturated fats, daily to have 5 or more servings of fruits and vegetables.  Education: All About Nutrition: -Group instruction provided by verbal, written material, interactive activities, discussions, models, and posters to present general guidelines for  heart healthy nutrition including fat, fiber, MyPlate, the role of sodium in heart healthy nutrition, utilization of the nutrition label, and utilization of this knowledge for meal planning. Follow up email sent as well. Written material given at graduation.   Biometrics:  Pre Biometrics - 10/28/23 1522       Pre Biometrics   Height 5' 7.4 (1.712 m)    Weight 284 lb 8 oz (129 kg)    Waist Circumference 56 inches    Hip Circumference 56.5 inches    Waist to Hip Ratio 0.99 %    BMI (Calculated) 44.03    Single Leg Stand 2 seconds           Nutrition Therapy Plan and Nutrition Goals:   Nutrition Assessments:  MEDIFICTS Score Key: >=70 Need to make dietary changes  40-70 Heart Healthy Diet <= 40 Therapeutic Level Cholesterol Diet   Picture Your Plate Scores: <04 Unhealthy dietary pattern with much room for improvement. 41-50 Dietary pattern unlikely to meet recommendations for good health and room for improvement. 51-60 More healthful dietary pattern, with some room for improvement.  >60 Healthy dietary pattern, although there may be some specific behaviors that could be improved.   Nutrition Goals Re-Evaluation:   Nutrition Goals Discharge (Final Nutrition Goals Re-Evaluation):   Psychosocial: Target Goals: Acknowledge presence or absence of significant depression and/or stress, maximize coping skills, provide positive support system. Participant is able to verbalize types and ability to use techniques and skills needed for reducing stress and depression.   Education: Stress, Anxiety, and Depression - Group verbal and visual presentation to define topics covered.  Reviews how body is impacted by stress, anxiety, and depression.  Also discusses healthy ways to reduce stress and to treat/manage anxiety and depression.  Written material given at graduation.   Education: Sleep Hygiene -Provides group verbal and written instruction about how sleep can affect your health.   Define sleep hygiene, discuss sleep cycles and impact of sleep habits. Review good sleep hygiene tips.    Initial Review & Psychosocial Screening:  Initial Psych Review & Screening - 10/27/23 1350       Initial Review   Current issues with History of Depression      Family Dynamics   Good Support System? Yes    Comments She can look to her wife and sister. She lost her mother 2 years ago  and her father six months after. Medication did not help and at times made her feel worse and does not take anything for her mood.      Barriers  Psychosocial barriers to participate in program The patient should benefit from training in stress management and relaxation.      Screening Interventions   Interventions Encouraged to exercise;To provide support and resources with identified psychosocial needs;Provide feedback about the scores to participant    Expected Outcomes Short Term goal: Utilizing psychosocial counselor, staff and physician to assist with identification of specific Stressors or current issues interfering with healing process. Setting desired goal for each stressor or current issue identified.;Long Term Goal: Stressors or current issues are controlled or eliminated.;Short Term goal: Identification and review with participant of any Quality of Life or Depression concerns found by scoring the questionnaire.;Long Term goal: The participant improves quality of Life and PHQ9 Scores as seen by post scores and/or verbalization of changes          Quality of Life Scores:  Scores of 19 and below usually indicate a poorer quality of life in these areas.  A difference of  2-3 points is a clinically meaningful difference.  A difference of 2-3 points in the total score of the Quality of Life Index has been associated with significant improvement in overall quality of life, self-image, physical symptoms, and general health in studies assessing change in quality of life.  PHQ-9: Review Flowsheet         No data to display         Interpretation of Total Score  Total Score Depression Severity:  1-4 = Minimal depression, 5-9 = Mild depression, 10-14 = Moderate depression, 15-19 = Moderately severe depression, 20-27 = Severe depression   Psychosocial Evaluation and Intervention:  Psychosocial Evaluation - 10/27/23 1357       Psychosocial Evaluation & Interventions   Interventions Encouraged to exercise with the program and follow exercise prescription;Relaxation education;Stress management education    Comments She can look to her wife and sister. She lost her mother 2 years ago and her father six months after. Medication did not help and at times made her feel worse and does not take anything for her mood.    Expected Outcomes Short: Start [facility] to help with mood. Long: Maintain a healthy mental state    Continue Psychosocial Services  Follow up required by staff          Psychosocial Re-Evaluation:   Psychosocial Discharge (Final Psychosocial Re-Evaluation):   Education: Education Goals: Education classes will be provided on a weekly basis, covering required topics. Participant will state understanding/return demonstration of topics presented.  Learning Barriers/Preferences:  Learning Barriers/Preferences - 10/27/23 1344       Learning Barriers/Preferences   Learning Barriers None    Learning Preferences None          General Pulmonary Education Topics:  Infection Prevention: - Provides verbal and written material to individual with discussion of infection control including proper hand washing and proper equipment cleaning during exercise session. Flowsheet Row Pulmonary Rehab from 10/28/2023 in [facility] Cardiac and Pulmonary Rehab  Date 10/28/23  Educator [facility]  Instruction Review Code 1- Verbalizes Understanding    Falls Prevention: - Provides verbal and written material to individual with discussion of falls prevention and safety. Flowsheet Row  Pulmonary Rehab from 10/28/2023 in [facility] Cardiac and Pulmonary Rehab  Date 10/28/23  Educator [facility]  Instruction Review Code 1- Verbalizes Understanding    Chronic Lung Disease Review: - Group verbal instruction with posters, models, PowerPoint presentations and videos,  to review new updates, new respiratory medications, new advancements in procedures and treatments. Providing information on websites and  800 numbers for continued self-education. Includes information about supplement oxygen, available portable oxygen systems, continuous and intermittent flow rates, oxygen safety, concentrators, and Medicare reimbursement for oxygen. Explanation of Pulmonary Drugs, including class, frequency, complications, importance of spacers, rinsing mouth after steroid MDI's, and proper cleaning methods for nebulizers. Review of basic lung anatomy and physiology related to function, structure, and complications of lung disease. Review of risk factors. Discussion about methods for diagnosing sleep apnea and types of masks and machines for OSA. Includes a review of the use of types of environmental controls: home humidity, furnaces, filters, dust mite/pet prevention, HEPA vacuums. Discussion about weather changes, air quality and the benefits of nasal washing. Instruction on Warning signs, infection symptoms, calling MD promptly, preventive modes, and value of vaccinations. Review of effective airway clearance, coughing and/or vibration techniques. Emphasizing that all should Create an Action Plan. Written material given at graduation.   AED/CPR: - Group verbal and written instruction with the use of models to demonstrate the basic use of the AED with the basic ABC's of resuscitation.    Anatomy and Cardiac Procedures: - Group verbal and visual presentation and models provide information about basic cardiac anatomy and function. Reviews the testing methods done to diagnose heart disease and the outcomes of the test  results. Describes the treatment choices: Medical Management, Angioplasty, or Coronary Bypass Surgery for treating various heart conditions including Myocardial Infarction, Angina, Valve Disease, and Cardiac Arrhythmias.  Written material given at graduation.   Medication Safety: - Group verbal and visual instruction to review commonly prescribed medications for heart and lung disease. Reviews the medication, class of the drug, and side effects. Includes the steps to properly store meds and maintain the prescription regimen.  Written material given at graduation.   Other: -Provides group and verbal instruction on various topics (see comments)   Knowledge Questionnaire Score:    Core Components/Risk Factors/Patient Goals at Admission:  Personal Goals and Risk Factors at Admission - 10/27/23 1347       Core Components/Risk Factors/Patient Goals on Admission    Weight Management Yes;Weight Loss    Intervention Weight Management: Develop a combined nutrition and exercise program designed to reach desired caloric intake, while maintaining appropriate intake of nutrient and fiber, sodium and fats, and appropriate energy expenditure required for the weight goal.;Weight Management: Provide education and appropriate resources to help participant work on and attain dietary goals.;Weight Management/Obesity: Establish reasonable short term and long term weight goals.;Obesity: Provide education and appropriate resources to help participant work on and attain dietary goals.    Expected Outcomes Short Term: Continue to assess and modify interventions until short term weight is achieved;Weight Loss: Understanding of general recommendations for a balanced deficit meal plan, which promotes 1-2 lb weight loss per week and includes a negative energy balance of [phone] kcal/d;Understanding of distribution of calorie intake throughout the day with the consumption of 4-5 meals/snacks;Understanding recommendations for  meals to include 15-35% energy as protein, 25-35% energy from fat, 35-60% energy from carbohydrates, less than 200mg  of dietary cholesterol, 20-35 gm of total fiber daily    Improve shortness of breath with ADL's Yes    Intervention Provide education, individualized exercise plan and daily activity instruction to help decrease symptoms of SOB with activities of daily living.    Expected Outcomes Short Term: Improve cardiorespiratory fitness to achieve a reduction of symptoms when performing ADLs;Long Term: Be able to perform more ADLs without symptoms or delay the onset of symptoms    Hypertension Yes  Intervention Provide education on lifestyle modifcations including regular physical activity/exercise, weight management, moderate sodium restriction and increased consumption of fresh fruit, vegetables, and low fat dairy, alcohol moderation, and smoking cessation.;Monitor prescription use compliance.    Expected Outcomes Short Term: Continued assessment and intervention until BP is < 140/104mm HG in hypertensive participants. < 130/21mm HG in hypertensive participants with diabetes, heart failure or chronic kidney disease.;Long Term: Maintenance of blood pressure at goal levels.          Education:Diabetes - Individual verbal and written instruction to review signs/symptoms of diabetes, desired ranges of glucose level fasting, after meals and with exercise. Acknowledge that pre and post exercise glucose checks will be done for 3 sessions at entry of program.   Know Your Numbers and Heart Failure: - Group verbal and visual instruction to discuss disease risk factors for cardiac and pulmonary disease and treatment options.  Reviews associated critical values for Overweight/Obesity, Hypertension, Cholesterol, and Diabetes.  Discusses basics of heart failure: signs/symptoms and treatments.  Introduces Heart Failure Zone chart for action plan for heart failure.  Written material given at  graduation.   Core Components/Risk Factors/Patient Goals Review:    Core Components/Risk Factors/Patient Goals at Discharge (Final Review):    ITP Comments:  ITP Comments     Row Name 10/27/23 1400 10/28/23 1514         ITP Comments Virtual Visit completed. Patient informed on EP and RD appointment and 6 Minute walk test. Patient also informed of patient health questionnaires on My Chart. Patient Verbalizes understanding. Visit diagnosis can be found in [facility] 09/25/2023. Completed and gym orientation for pulmonary rehab. Initial ITP created and sent for review to Dr. Faud Aleskerov, Medical Director.         Comments: Initial ITP

## 2023-10-30 ENCOUNTER — Ambulatory Visit

## 2023-11-03 ENCOUNTER — Ambulatory Visit

## 2023-11-06 ENCOUNTER — Ambulatory Visit

## 2023-11-10 ENCOUNTER — Ambulatory Visit

## 2023-11-12 ENCOUNTER — Encounter: Payer: Self-pay | Admitting: *Deleted

## 2023-11-12 DIAGNOSIS — J4489 Other specified chronic obstructive pulmonary disease: Secondary | ICD-10-CM

## 2023-11-12 NOTE — Progress Notes (Signed)
 Pulmonary Individual Treatment Plan  Patient Details  Name: Anna [name] MRN: [id number] Date of Birth: [birth year] Referring Provider:   Flowsheet Row Pulmonary Rehab from 10/28/2023 in [facility] Cardiac and Pulmonary Rehab  Referring Provider Dr. Darrin Barn    Initial Encounter Date:  Flowsheet Row Pulmonary Rehab from 10/28/2023 in [facility] Cardiac and Pulmonary Rehab  Date 10/28/23    Visit Diagnosis: COPD with chronic bronchitis and emphysema (HCC)  Patient's Home Medications on Admission:  Current Outpatient Medications:    albuterol  (PROAIR  HFA) 108 (90 BASE) MCG/ACT inhaler, Inhale 2 puffs into the lungs 4 (four) times daily., Disp: 1 Inhaler, Rfl: 11   albuterol  (PROVENTIL ) (2.5 MG/3ML) 0.083% nebulizer solution, Take 3 mLs (2.5 mg total) by nebulization as directed. (Patient not taking: Reported on 10/27/2023), Disp: 75 mL, Rfl: 11   albuterol  (PROVENTIL ) (2.5 MG/3ML) 0.083% nebulizer solution, Take 3 mLs (2.5 mg total) by nebulization every 6 (six) hours as needed for wheezing or shortness of breath., Disp: 75 mL, Rfl: 12   albuterol  (VENTOLIN  HFA) 108 (90 Base) MCG/ACT inhaler, Inhale 2 puffs into the lungs every 6 (six) hours as needed for wheezing or shortness of breath. (Patient not taking: Reported on 10/27/2023), Disp: 8 g, Rfl: 2   albuterol  (VENTOLIN  HFA) 108 (90 Base) MCG/ACT inhaler, Inhale 2 puffs into the lungs every 6 (six) hours as needed for wheezing or shortness of breath. (Patient not taking: Reported on 10/27/2023), Disp: 8 g, Rfl: 2   Biotin 1000 MCG tablet, Take 1,000 mcg by mouth daily.   (Patient not taking: Reported on 09/25/2023), Disp: , Rfl:    brimonidine (ALPHAGAN P) 0.1 % SOLN, as directed.   (Patient not taking: Reported on 09/25/2023), Disp: , Rfl:    brinzolamide (AZOPT) 1 % ophthalmic suspension, 1 drop as directed.   (Patient not taking: Reported on 09/25/2023), Disp: , Rfl:    Calcium-Vitamin D-Vitamin K ([employer] CALCIUM CHEWS) 500-200-40 MG-UNT-MCG  CHEW, Chew by mouth daily.   (Patient not taking: Reported on 09/25/2023), Disp: , Rfl:    chlorpheniramine-HYDROcodone  (TUSSIONEX PENNKINETIC ER) 10-8 MG/5ML LQCR, Take 5 mLs by mouth every 12 (twelve) hours. (Patient not taking: Reported on 09/25/2023), Disp: 115 mL, Rfl: 0   chlorpheniramine-HYDROcodone  (TUSSIONEX) 10-8 MG/5ML LQCR, Take 5 mLs by mouth every 12 (twelve) hours as needed. (Patient not taking: Reported on 09/25/2023), Disp: 140 mL, Rfl: 0   cyclobenzaprine  (FLEXERIL ) 5 MG tablet, Take 1 tablet (5 mg total) by mouth at bedtime as needed for muscle spasms. (Patient not taking: Reported on 09/25/2023), Disp: 12 tablet, Rfl: 0   Diphenhydramine-APAP, sleep, (TYLENOL  PM EXTRA STRENGTH PO), Take by mouth.   (Patient not taking: Reported on 09/25/2023), Disp: , Rfl:    Fluticasone-Umeclidin-Vilant (TRELEGY ELLIPTA ) 200-62.5-25 MCG/ACT AEPB, Inhale 1 puff into the lungs daily., Disp: 3 each, Rfl: 6   Fluticasone-Umeclidin-Vilant (TRELEGY ELLIPTA ) 200-62.5-25 MCG/ACT AEPB, Inhale 1 puff into the lungs daily., Disp: 28 each, Rfl: 0   HYDROcodone -acetaminophen  (NORCO/VICODIN) 5-325 MG tablet, Take 1-2 tablets by mouth every 4 (four) hours as needed. (Patient not taking: Reported on 09/25/2023), Disp: 20 tablet, Rfl: 0   loratadine (CLARITIN) 10 MG tablet, Take 10 mg by mouth daily.   (Patient not taking: Reported on 09/25/2023), Disp: , Rfl:    metoprolol  tartrate (LOPRESSOR ) 25 MG tablet, Take 1 tablet (25 mg total) by mouth 2 (two) times daily., Disp: 60 tablet, Rfl: 0   montelukast (SINGULAIR) 10 MG tablet, Take 10 mg by mouth at bedtime.  ,  Disp: , Rfl:    Multiple Vitamins-Iron (MULTI-VITAMIN/IRON) TABS, Take 1 tablet by mouth 2 (two) times daily. Flintstones chew  (Patient not taking: Reported on 09/25/2023), Disp: , Rfl:    ondansetron  (ZOFRAN ) 4 MG tablet, Take 1 tablet (4 mg total) by mouth every 8 (eight) hours as needed for nausea or vomiting. (Patient not taking: Reported on 09/25/2023), Disp:  12 tablet, Rfl: 0   oxyCODONE  (OXY IR/ROXICODONE ) 5 MG immediate release tablet, Take 1 tablet (5 mg total) by mouth every 4 (four) hours as needed for severe pain. Try not to do more than 4 times a day. (Patient not taking: Reported on 09/25/2023), Disp: 30 tablet, Rfl: 0   oxyCODONE -acetaminophen  (PERCOCET/ROXICET) 5-325 MG tablet, Take 1 tablet by mouth every 6 (six) hours as needed for moderate pain or severe pain. (Patient not taking: Reported on 09/25/2023), Disp: 20 tablet, Rfl: 0   Pseudoeph-Chlorphen-Hydrocod (ZUTRIPRO) 60-4-5 MG/5ML SOLN, Take 5 mLs by mouth 4 (four) times daily as needed. (Patient not taking: Reported on 09/25/2023), Disp: 240 mL, Rfl: 1   traMADol (ULTRAM) 50 MG tablet, Take 50 mg by mouth every 6 (six) hours as needed.   (Patient not taking: Reported on 09/25/2023), Disp: , Rfl:    travoprost, benzalkonium, (TRAVATAN Z) 0.004 % ophthalmic solution, Place 1 drop into both eyes at bedtime.   (Patient not taking: Reported on 09/25/2023), Disp: , Rfl:    varenicline (CHANTIX CONTINUING MONTH PAK) 1 MG tablet, Take 1 mg by mouth as directed.   (Patient not taking: Reported on 09/25/2023), Disp: , Rfl:    vitamin B-12 (CYANOCOBALAMIN) 1000 MCG tablet, Take 1,000 mcg by mouth daily.   (Patient not taking: Reported on 09/25/2023), Disp: , Rfl:    zolpidem  (AMBIEN  CR) 12.5 MG CR tablet, Take 1 tablet (12.5 mg total) by mouth at bedtime as needed for sleep., Disp: 30 tablet, Rfl: 5  Past Medical History: Past Medical History:  Diagnosis Date   Allergy    Asthma    COPD (chronic obstructive pulmonary disease) (HCC)    PFT 07/22/08-FEV1/FVC 0.49   Degenerative disc disease    Edema    Family history of ischemic heart disease    Family history of psychiatric condition    GERD (gastroesophageal reflux disease)    Glaucoma    Obesity, unspecified    Obstructive sleep apnea (adult) (pediatric)    Pneumonia    hosp 2002   Scimitar syndrome    Sleep apnea    NPSG 09/09/07 AHI 8.1/hr  prolong desat   Tobacco use disorder     Tobacco Use: Social History   Tobacco Use  Smoking Status Former   Current packs/day: 0.00   Types: Cigarettes, E-cigarettes   Quit date: 06/25/2010   Years since quitting: 13.3  Smokeless Tobacco Never    Labs: Review Flowsheet  More data may exist      Latest Ref Rng & Units 11/12/2006 03/09/2007 09/07/2007 05/20/2008 08/24/2010  Labs for ITP Cardiac and Pulmonary Rehab  Cholestrol 0 - 200 mg/dL - - - 811  786   LDL (calc) 0 - 99 mg/dL - - - 862  -  Direct LDL mg/dL - - - - 867.2   HDL-C >60.99 mg/dL - - - 65.7  39.39   Trlycerides 0.0 - 149.0 mg/dL - - - 84  04.9   Bicarbonate - 29.2  24.6  - - -  TCO2 - 31  26  29   - -     Pulmonary  Assessment Scores:  Pulmonary Assessment Scores     Row Name 10/28/23 1523         mMRC Score   mMRC Score 3        UCSD: Self-administered rating of dyspnea associated with activities of daily living (ADLs) 6-point scale (0 = not at all to 5 = maximal or unable to do because of breathlessness)  Scoring Scores range from 0 to 120.  Minimally important difference is 5 units  CAT: CAT can identify the health impairment of COPD patients and is better correlated with disease progression.  CAT has a scoring range of zero to 40. The CAT score is classified into four groups of low (less than 10), medium (10 - 20), high (21-30) and very high (31-40) based on the impact level of disease on health status. A CAT score over 10 suggests significant symptoms.  A worsening CAT score could be explained by an exacerbation, poor medication adherence, poor inhaler technique, or progression of COPD or comorbid conditions.  CAT MCID is 2 points  mMRC: mMRC (Modified Medical Research Council) Dyspnea Scale is used to assess the degree of baseline functional disability in patients of respiratory disease due to dyspnea. No minimal important difference is established. A decrease in score of 1 point or greater is  considered a positive change.   Pulmonary Function Assessment:  Pulmonary Function Assessment - 10/27/23 1342       Post Bronchodilator Spirometry Results   FVC% 54 %    FEV1% 33 %    FEV1/FVC Ratio 49      Breath   Shortness of Breath Yes;Limiting activity          Exercise Target Goals: Exercise Program Goal: Individual exercise prescription set using results from initial 6 min walk test and THRR while considering  patient's activity barriers and safety.   Exercise Prescription Goal: Initial exercise prescription builds to 30-45 minutes a day of aerobic activity, 2-3 days per week.  Home exercise guidelines will be given to patient during program as part of exercise prescription that the participant will acknowledge.  Education: Aerobic Exercise: - Group verbal and visual presentation on the components of exercise prescription. Introduces F.I.T.T principle from ACSM for exercise prescriptions.  Reviews F.I.T.T. principles of aerobic exercise including progression. Written material given at graduation.   Education: Resistance Exercise: - Group verbal and visual presentation on the components of exercise prescription. Introduces F.I.T.T principle from ACSM for exercise prescriptions  Reviews F.I.T.T. principles of resistance exercise including progression. Written material given at graduation.    Education: Exercise & Equipment Safety: - Individual verbal instruction and demonstration of equipment use and safety with use of the equipment. Flowsheet Row Pulmonary Rehab from 10/28/2023 in [facility] Cardiac and Pulmonary Rehab  Date 10/28/23  Educator [facility]  Instruction Review Code 1- Verbalizes Understanding    Education: Exercise Physiology & General Exercise Guidelines: - Group verbal and written instruction with models to review the exercise physiology of the cardiovascular system and associated critical values. Provides general exercise guidelines with specific guidelines to those  with heart or lung disease.    Education: Flexibility, Balance, Mind/Body Relaxation: - Group verbal and visual presentation with interactive activity on the components of exercise prescription. Introduces F.I.T.T principle from ACSM for exercise prescriptions. Reviews F.I.T.T. principles of flexibility and balance exercise training including progression. Also discusses the mind body connection.  Reviews various relaxation techniques to help reduce and manage stress (i.e. Deep breathing, progressive muscle relaxation, and visualization). Balance handout  provided to take home. Written material given at graduation.   Activity Barriers & Risk Stratification:  Activity Barriers & Cardiac Risk Stratification - 10/28/23 1517       Activity Barriers & Cardiac Risk Stratification   Activity Barriers Joint Problems;Deconditioning;Muscular Weakness;Shortness of Breath          6 Minute Walk:  6 Minute Walk     Row Name 10/28/23 1514         6 Minute Walk   Phase Initial     Distance 455 feet     Walk Time 6 minutes     # of Rest Breaks 0     MPH 0.86     METS 2.14     RPE 17     Perceived Dyspnea  2     VO2 Peak 7.48     Symptoms Yes (comment)     Comments Left knee pain 7/10     Resting HR 80 bpm     Resting BP 122/82     Resting Oxygen Saturation  96 %     Exercise Oxygen Saturation  during 6 min walk 88 %     Max Ex. HR 125 bpm     Max Ex. BP 156/84     2 Minute Post BP 128/84       Interval HR   1 Minute HR 105     2 Minute HR 106     3 Minute HR 84     4 Minute HR 117     5 Minute HR 125     6 Minute HR 124     2 Minute Post HR 9     Interval Heart Rate? Yes       Interval Oxygen   Interval Oxygen? Yes     Baseline Oxygen Saturation % 96 %     1 Minute Oxygen Saturation % 93 %     1 Minute Liters of Oxygen 0 L     2 Minute Oxygen Saturation % 88 %     2 Minute Liters of Oxygen 0 L     3 Minute Oxygen Saturation % 91 %     3 Minute Liters of Oxygen 0 L     4  Minute Oxygen Saturation % 94 %     4 Minute Liters of Oxygen 0 L     5 Minute Oxygen Saturation % 94 %     5 Minute Liters of Oxygen 0 L     6 Minute Oxygen Saturation % 92 %     6 Minute Liters of Oxygen 0 L     2 Minute Post Oxygen Saturation % 93 %     2 Minute Post Liters of Oxygen 0 L       Oxygen Initial Assessment:  Oxygen Initial Assessment - 10/28/23 1523       Home Oxygen   Home Oxygen Device None    Sleep Oxygen Prescription None    Home Exercise Oxygen Prescription None    Home Resting Oxygen Prescription None      Initial 6 min Walk   Oxygen Used None      Program Oxygen Prescription   Program Oxygen Prescription None      Intervention   Short Term Goals To learn and understand importance of monitoring SPO2 with pulse oximeter and demonstrate accurate use of the pulse oximeter.;To learn and demonstrate proper pursed lip breathing techniques or other breathing techniques. ;To learn  and exhibit compliance with exercise, home and travel O2 prescription;To learn and understand importance of maintaining oxygen saturations>88%;To learn and demonstrate proper use of respiratory medications    Long  Term Goals Exhibits compliance with exercise, home  and travel O2 prescription;Compliance with respiratory medication;Maintenance of O2 saturations>88%;Exhibits proper breathing techniques, such as pursed lip breathing or other method taught during program session;Verbalizes importance of monitoring SPO2 with pulse oximeter and return demonstration;Demonstrates proper use of MDI's          Oxygen Re-Evaluation:   Oxygen Discharge (Final Oxygen Re-Evaluation):   Initial Exercise Prescription:  Initial Exercise Prescription - 10/28/23 1500       Date of Initial Exercise RX and Referring Provider   Date 10/28/23    Referring Provider Dr. Darrin Barn      Oxygen   Maintain Oxygen Saturation 88% or higher      Treadmill   MPH 0.8    Grade 0    Minutes 15     METs 2.14      Recumbant Bike   Level 1    RPM 50    Watts 25    Minutes 15    METs 2.14      NuStep   Level 1    SPM 80    Minutes 15    METs 2.14      REL-XR   Level 1    Watts 25    Speed 50    Minutes 15    METs 2.14      Biostep-RELP   Level 1    SPM 50    Minutes 15    METs 2.14      Track   Laps 15    Minutes 15    METs 2.14      Intensity   THRR 40-80% of Max Heartrate 116-153    Ratings of Perceived Exertion 11-13    Perceived Dyspnea 0-4      Progression   Progression Continue to progress workloads to maintain intensity without signs/symptoms of physical distress.      Resistance Training   Training Prescription Yes    Weight 5lb    Reps 10-15          Perform Capillary Blood Glucose checks as needed.  Exercise Prescription Changes:   Exercise Prescription Changes     Row Name 10/28/23 1500             Response to Exercise   Blood Pressure (Admit) 122/82       Blood Pressure (Exercise) 156/84       Blood Pressure (Exit) 128/84       Heart Rate (Admit) 80 bpm       Heart Rate (Exercise) 125 bpm       Heart Rate (Exit) 90 bpm       Oxygen Saturation (Admit) 96 %       Oxygen Saturation (Exercise) 88 %       Oxygen Saturation (Exit) 93 %       Rating of Perceived Exertion (Exercise) 17       Perceived Dyspnea (Exercise) 3       Symptoms Left knee pain 7/10       Comments results          Exercise Comments:   Exercise Goals and Review:   Exercise Goals     Row Name 10/28/23 1522             Exercise  Goals   Increase Physical Activity Yes       Intervention Provide advice, education, support and counseling about physical activity/exercise needs.;Develop an individualized exercise prescription for aerobic and resistive training based on initial evaluation findings, risk stratification, comorbidities and participant's personal goals.       Expected Outcomes Short Term: Attend rehab on a regular basis to increase  amount of physical activity.;Long Term: Add in home exercise to make exercise part of routine and to increase amount of physical activity.;Long Term: Exercising regularly at least 3-5 days a week.       Increase Strength and Stamina Yes       Intervention Provide advice, education, support and counseling about physical activity/exercise needs.;Develop an individualized exercise prescription for aerobic and resistive training based on initial evaluation findings, risk stratification, comorbidities and participant's personal goals.       Expected Outcomes Short Term: Increase workloads from initial exercise prescription for resistance, speed, and METs.;Short Term: Perform resistance training exercises routinely during rehab and add in resistance training at home;Long Term: Improve cardiorespiratory fitness, muscular endurance and strength as measured by increased METs and functional capacity ( )       Able to understand and use rate of perceived exertion (RPE) scale Yes       Intervention Provide education and explanation on how to use RPE scale       Expected Outcomes Short Term: Able to use RPE daily in rehab to express subjective intensity level;Long Term:  Able to use RPE to guide intensity level when exercising independently       Able to understand and use Dyspnea scale Yes       Intervention Provide education and explanation on how to use Dyspnea scale       Expected Outcomes Short Term: Able to use Dyspnea scale daily in rehab to express subjective sense of shortness of breath during exertion;Long Term: Able to use Dyspnea scale to guide intensity level when exercising independently       Knowledge and understanding of Target Heart Rate Range (THRR) Yes       Intervention Provide education and explanation of THRR including how the numbers were predicted and where they are located for reference       Expected Outcomes Short Term: Able to state/look up THRR;Short Term: Able to use daily as  guideline for intensity in rehab;Long Term: Able to use THRR to govern intensity when exercising independently       Able to check pulse independently Yes       Intervention Provide education and demonstration on how to check pulse in carotid and radial arteries.;Review the importance of being able to check your own pulse for safety during independent exercise       Expected Outcomes Short Term: Able to explain why pulse checking is important during independent exercise;Long Term: Able to check pulse independently and accurately       Understanding of Exercise Prescription Yes       Intervention Provide education, explanation, and written materials on patient's individual exercise prescription       Expected Outcomes Short Term: Able to explain program exercise prescription;Long Term: Able to explain home exercise prescription to exercise independently          Exercise Goals Re-Evaluation :   Discharge Exercise Prescription (Final Exercise Prescription Changes):  Exercise Prescription Changes - 10/28/23 1500       Response to Exercise   Blood Pressure (Admit) 122/82    Blood Pressure (  Exercise) 156/84    Blood Pressure (Exit) 128/84    Heart Rate (Admit) 80 bpm    Heart Rate (Exercise) 125 bpm    Heart Rate (Exit) 90 bpm    Oxygen Saturation (Admit) 96 %    Oxygen Saturation (Exercise) 88 %    Oxygen Saturation (Exit) 93 %    Rating of Perceived Exertion (Exercise) 17    Perceived Dyspnea (Exercise) 3    Symptoms Left knee pain 7/10    Comments results          Nutrition:  Target Goals: Understanding of nutrition guidelines, daily intake of sodium 1500mg , cholesterol 200mg , calories 30% from fat and 7% or less from saturated fats, daily to have 5 or more servings of fruits and vegetables.  Education: All About Nutrition: -Group instruction provided by verbal, written material, interactive activities, discussions, models, and posters to present general guidelines for  heart healthy nutrition including fat, fiber, MyPlate, the role of sodium in heart healthy nutrition, utilization of the nutrition label, and utilization of this knowledge for meal planning. Follow up email sent as well. Written material given at graduation.   Biometrics:  Pre Biometrics - 10/28/23 1522       Pre Biometrics   Height 5' 7.4 (1.712 m)    Weight 284 lb 8 oz (129 kg)    Waist Circumference 56 inches    Hip Circumference 56.5 inches    Waist to Hip Ratio 0.99 %    BMI (Calculated) 44.03    Single Leg Stand 2 seconds           Nutrition Therapy Plan and Nutrition Goals:   Nutrition Assessments:  MEDIFICTS Score Key: >=70 Need to make dietary changes  40-70 Heart Healthy Diet <= 40 Therapeutic Level Cholesterol Diet   Picture Your Plate Scores: <59 Unhealthy dietary pattern with much room for improvement. 41-50 Dietary pattern unlikely to meet recommendations for good health and room for improvement. 51-60 More healthful dietary pattern, with some room for improvement.  >60 Healthy dietary pattern, although there may be some specific behaviors that could be improved.   Nutrition Goals Re-Evaluation:   Nutrition Goals Discharge (Final Nutrition Goals Re-Evaluation):   Psychosocial: Target Goals: Acknowledge presence or absence of significant depression and/or stress, maximize coping skills, provide positive support system. Participant is able to verbalize types and ability to use techniques and skills needed for reducing stress and depression.   Education: Stress, Anxiety, and Depression - Group verbal and visual presentation to define topics covered.  Reviews how body is impacted by stress, anxiety, and depression.  Also discusses healthy ways to reduce stress and to treat/manage anxiety and depression.  Written material given at graduation.   Education: Sleep Hygiene -Provides group verbal and written instruction about how sleep can affect your health.   Define sleep hygiene, discuss sleep cycles and impact of sleep habits. Review good sleep hygiene tips.    Initial Review & Psychosocial Screening:  Initial Psych Review & Screening - 10/27/23 1350       Initial Review   Current issues with History of Depression      Family Dynamics   Good Support System? Yes    Comments She can look to her wife and sister. She lost her mother 2 years ago  and her father six months after. Medication did not help and at times made her feel worse and does not take anything for her mood.      Barriers  Psychosocial barriers to participate in program The patient should benefit from training in stress management and relaxation.      Screening Interventions   Interventions Encouraged to exercise;To provide support and resources with identified psychosocial needs;Provide feedback about the scores to participant    Expected Outcomes Short Term goal: Utilizing psychosocial counselor, staff and physician to assist with identification of specific Stressors or current issues interfering with healing process. Setting desired goal for each stressor or current issue identified.;Long Term Goal: Stressors or current issues are controlled or eliminated.;Short Term goal: Identification and review with participant of any Quality of Life or Depression concerns found by scoring the questionnaire.;Long Term goal: The participant improves quality of Life and PHQ9 Scores as seen by post scores and/or verbalization of changes          Quality of Life Scores:  Scores of 19 and below usually indicate a poorer quality of life in these areas.  A difference of  2-3 points is a clinically meaningful difference.  A difference of 2-3 points in the total score of the Quality of Life Index has been associated with significant improvement in overall quality of life, self-image, physical symptoms, and general health in studies assessing change in quality of life.  PHQ-9: Review Flowsheet        10/28/2023  Depression screen PHQ 2/9  Decreased Interest 1  Down, Depressed, Hopeless 1  PHQ - 2 Score 2  Altered sleeping 3  Tired, decreased energy 2  Change in appetite 1  Feeling bad or failure about yourself  0  Trouble concentrating 0  Moving slowly or fidgety/restless 0  Suicidal thoughts 0  PHQ-9 Score 8  Difficult doing work/chores Very difficult   Interpretation of Total Score  Total Score Depression Severity:  1-4 = Minimal depression, 5-9 = Mild depression, 10-14 = Moderate depression, 15-19 = Moderately severe depression, 20-27 = Severe depression   Psychosocial Evaluation and Intervention:  Psychosocial Evaluation - 10/27/23 1357       Psychosocial Evaluation & Interventions   Interventions Encouraged to exercise with the program and follow exercise prescription;Relaxation education;Stress management education    Comments She can look to her wife and sister. She lost her mother 2 years ago and her father six months after. Medication did not help and at times made her feel worse and does not take anything for her mood.    Expected Outcomes Short: Start [facility] to help with mood. Long: Maintain a healthy mental state    Continue Psychosocial Services  Follow up required by staff          Psychosocial Re-Evaluation:   Psychosocial Discharge (Final Psychosocial Re-Evaluation):   Education: Education Goals: Education classes will be provided on a weekly basis, covering required topics. Participant will state understanding/return demonstration of topics presented.  Learning Barriers/Preferences:  Learning Barriers/Preferences - 10/27/23 1344       Learning Barriers/Preferences   Learning Barriers None    Learning Preferences None          General Pulmonary Education Topics:  Infection Prevention: - Provides verbal and written material to individual with discussion of infection control including proper hand washing and proper equipment  cleaning during exercise session. Flowsheet Row Pulmonary Rehab from 10/28/2023 in [facility]  Date 10/28/23  Educator [facility]  Instruction Review Code 1- Verbalizes Understanding    Falls Prevention: - Provides verbal and written material to individual with discussion of falls prevention and safety. Flowsheet Row Pulmonary Rehab from  10/28/2023 in [facility] Cardiac and Pulmonary Rehab  Date 10/28/23  Educator [facility]  Instruction Review Code 1- Verbalizes Understanding    Chronic Lung Disease Review: - Group verbal instruction with posters, models, PowerPoint presentations and videos,  to review new updates, new respiratory medications, new advancements in procedures and treatments. Providing information on websites and 800 numbers for continued self-education. Includes information about supplement oxygen, available portable oxygen systems, continuous and intermittent flow rates, oxygen safety, concentrators, and Medicare reimbursement for oxygen. Explanation of Pulmonary Drugs, including class, frequency, complications, importance of spacers, rinsing mouth after steroid MDI's, and proper cleaning methods for nebulizers. Review of basic lung anatomy and physiology related to function, structure, and complications of lung disease. Review of risk factors. Discussion about methods for diagnosing sleep apnea and types of masks and machines for OSA. Includes a review of the use of types of environmental controls: home humidity, furnaces, filters, dust mite/pet prevention, HEPA vacuums. Discussion about weather changes, air quality and the benefits of nasal washing. Instruction on Warning signs, infection symptoms, calling MD promptly, preventive modes, and value of vaccinations. Review of effective airway clearance, coughing and/or vibration techniques. Emphasizing that all should Create an Action Plan. Written material given at graduation.   AED/CPR: - Group verbal and written instruction  with the use of models to demonstrate the basic use of the AED with the basic ABC's of resuscitation.    Anatomy and Cardiac Procedures: - Group verbal and visual presentation and models provide information about basic cardiac anatomy and function. Reviews the testing methods done to diagnose heart disease and the outcomes of the test results. Describes the treatment choices: Medical Management, Angioplasty, or Coronary Bypass Surgery for treating various heart conditions including Myocardial Infarction, Angina, Valve Disease, and Cardiac Arrhythmias.  Written material given at graduation.   Medication Safety: - Group verbal and visual instruction to review commonly prescribed medications for heart and lung disease. Reviews the medication, class of the drug, and side effects. Includes the steps to properly store meds and maintain the prescription regimen.  Written material given at graduation.   Other: -Provides group and verbal instruction on various topics (see comments)   Knowledge Questionnaire Score:    Core Components/Risk Factors/Patient Goals at Admission:  Personal Goals and Risk Factors at Admission - 10/27/23 1347       Core Components/Risk Factors/Patient Goals on Admission    Weight Management Yes;Weight Loss    Intervention Weight Management: Develop a combined nutrition and exercise program designed to reach desired caloric intake, while maintaining appropriate intake of nutrient and fiber, sodium and fats, and appropriate energy expenditure required for the weight goal.;Weight Management: Provide education and appropriate resources to help participant work on and attain dietary goals.;Weight Management/Obesity: Establish reasonable short term and long term weight goals.;Obesity: Provide education and appropriate resources to help participant work on and attain dietary goals.    Expected Outcomes Short Term: Continue to assess and modify interventions until short term weight  is achieved;Weight Loss: Understanding of general recommendations for a balanced deficit meal plan, which promotes 1-2 lb weight loss per week and includes a negative energy balance of [phone] kcal/d;Understanding of distribution of calorie intake throughout the day with the consumption of 4-5 meals/snacks;Understanding recommendations for meals to include 15-35% energy as protein, 25-35% energy from fat, 35-60% energy from carbohydrates, less than 200mg  of dietary cholesterol, 20-35 gm of total fiber daily    Improve shortness of breath with ADL's Yes    Intervention Provide education, individualized  exercise plan and daily activity instruction to help decrease symptoms of SOB with activities of daily living.    Expected Outcomes Short Term: Improve cardiorespiratory fitness to achieve a reduction of symptoms when performing ADLs;Long Term: Be able to perform more ADLs without symptoms or delay the onset of symptoms    Hypertension Yes    Intervention Provide education on lifestyle modifcations including regular physical activity/exercise, weight management, moderate sodium restriction and increased consumption of fresh fruit, vegetables, and low fat dairy, alcohol moderation, and smoking cessation.;Monitor prescription use compliance.    Expected Outcomes Short Term: Continued assessment and intervention until BP is < 140/74mm HG in hypertensive participants. < 130/7mm HG in hypertensive participants with diabetes, heart failure or chronic kidney disease.;Long Term: Maintenance of blood pressure at goal levels.          Education:Diabetes - Individual verbal and written instruction to review signs/symptoms of diabetes, desired ranges of glucose level fasting, after meals and with exercise. Acknowledge that pre and post exercise glucose checks will be done for 3 sessions at entry of program.   Know Your Numbers and Heart Failure: - Group verbal and visual instruction to discuss disease risk  factors for cardiac and pulmonary disease and treatment options.  Reviews associated critical values for Overweight/Obesity, Hypertension, Cholesterol, and Diabetes.  Discusses basics of heart failure: signs/symptoms and treatments.  Introduces Heart Failure Zone chart for action plan for heart failure.  Written material given at graduation.   Core Components/Risk Factors/Patient Goals Review:    Core Components/Risk Factors/Patient Goals at Discharge (Final Review):    ITP Comments:  ITP Comments     Row Name 10/27/23 1400 10/28/23 1514 11/12/23 1104       ITP Comments Virtual Visit completed. Patient informed on EP and RD appointment and 6 Minute walk test. Patient also informed of patient health questionnaires on My Chart. Patient Verbalizes understanding. Visit diagnosis can be found in [facility] 09/25/2023. Completed and gym orientation for pulmonary rehab. Initial ITP created and sent for review to Dr. Faud Aleskerov, Medical Director. 30 Day review completed. Medical Director ITP review done, changes made as directed, and signed approval by Medical Director.        Comments: 30 day review

## 2023-11-13 ENCOUNTER — Ambulatory Visit

## 2023-11-17 ENCOUNTER — Ambulatory Visit

## 2023-11-20 ENCOUNTER — Ambulatory Visit

## 2023-11-20 ENCOUNTER — Telehealth: Payer: Self-pay | Admitting: *Deleted

## 2023-11-20 NOTE — Telephone Encounter (Signed)
 Left message for patient regarding attendance. She has not attended since her orientation on 6/17.

## 2023-11-24 ENCOUNTER — Ambulatory Visit

## 2023-11-26 ENCOUNTER — Telehealth: Payer: Self-pay | Admitting: *Deleted

## 2023-11-26 NOTE — Telephone Encounter (Signed)
 Called Anna [name] since she has been absent from the program. Asked her to call back to follow up if she is going to start attending or be discharged.

## 2023-11-27 ENCOUNTER — Ambulatory Visit

## 2023-12-01 ENCOUNTER — Ambulatory Visit

## 2023-12-03 ENCOUNTER — Encounter: Payer: Self-pay | Admitting: *Deleted

## 2023-12-03 DIAGNOSIS — J4489 Other specified chronic obstructive pulmonary disease: Secondary | ICD-10-CM

## 2023-12-03 NOTE — Progress Notes (Signed)
 Early Discharge Summary  Anna [name] DOB: [birth year]  Discharge Letter sent due to lack of attendance and unsuccessful communication attempts. She has not attended since her orientation on 6/17. She will be discharged at this time.    6 Minute Walk     Row Name 10/28/23 1514         6 Minute Walk   Phase Initial     Distance 455 feet     Walk Time 6 minutes     # of Rest Breaks 0     MPH 0.86     METS 2.14     RPE 17     Perceived Dyspnea  2     VO2 Peak 7.48     Symptoms Yes (comment)     Comments Left knee pain 7/10     Resting HR 80 bpm     Resting BP 122/82     Resting Oxygen Saturation  96 %     Exercise Oxygen Saturation  during 6 min walk 88 %     Max Ex. HR 125 bpm     Max Ex. BP 156/84     2 Minute Post BP 128/84       Interval HR   1 Minute HR 105     2 Minute HR 106     3 Minute HR 84     4 Minute HR 117     5 Minute HR 125     6 Minute HR 124     2 Minute Post HR 9     Interval Heart Rate? Yes       Interval Oxygen   Interval Oxygen? Yes     Baseline Oxygen Saturation % 96 %     1 Minute Oxygen Saturation % 93 %     1 Minute Liters of Oxygen 0 L     2 Minute Oxygen Saturation % 88 %     2 Minute Liters of Oxygen 0 L     3 Minute Oxygen Saturation % 91 %     3 Minute Liters of Oxygen 0 L     4 Minute Oxygen Saturation % 94 %     4 Minute Liters of Oxygen 0 L     5 Minute Oxygen Saturation % 94 %     5 Minute Liters of Oxygen 0 L     6 Minute Oxygen Saturation % 92 %     6 Minute Liters of Oxygen 0 L     2 Minute Post Oxygen Saturation % 93 %     2 Minute Post Liters of Oxygen 0 L

## 2023-12-03 NOTE — Progress Notes (Signed)
 Pulmonary Individual Treatment Plan  Patient Details  Name: Anna [name] MRN: [id number] Date of Birth: 1975-06-02 Referring Provider:   Flowsheet Row Pulmonary Rehab from 10/28/2023 in [facility]  Referring Provider Dr. Darrin Barn    Initial Encounter Date:  Flowsheet Row Pulmonary Rehab from 10/28/2023 in [facility]  Date 10/28/23    Visit Diagnosis: COPD with chronic bronchitis and emphysema (HCC)  Patient's Home Medications on Admission:  Current Outpatient Medications:    albuterol  (PROAIR  HFA) 108 (90 BASE) MCG/ACT inhaler, Inhale 2 puffs into the lungs 4 (four) times daily., Disp: 1 Inhaler, Rfl: 11   albuterol  (PROVENTIL ) (2.5 MG/3ML) 0.083% nebulizer solution, Take 3 mLs (2.5 mg total) by nebulization as directed. (Patient not taking: Reported on 10/27/2023), Disp: 75 mL, Rfl: 11   albuterol  (PROVENTIL ) (2.5 MG/3ML) 0.083% nebulizer solution, Take 3 mLs (2.5 mg total) by nebulization every 6 (six) hours as needed for wheezing or shortness of breath., Disp: 75 mL, Rfl: 12   albuterol  (VENTOLIN  HFA) 108 (90 Base) MCG/ACT inhaler, Inhale 2 puffs into the lungs every 6 (six) hours as needed for wheezing or shortness of breath. (Patient not taking: Reported on 10/27/2023), Disp: 8 g, Rfl: 2   albuterol  (VENTOLIN  HFA) 108 (90 Base) MCG/ACT inhaler, Inhale 2 puffs into the lungs every 6 (six) hours as needed for wheezing or shortness of breath. (Patient not taking: Reported on 10/27/2023), Disp: 8 g, Rfl: 2   Biotin 1000 MCG tablet, Take 1,000 mcg by mouth daily.   (Patient not taking: Reported on 09/25/2023), Disp: , Rfl:    brimonidine (ALPHAGAN P) 0.1 % SOLN, as directed.   (Patient not taking: Reported on 09/25/2023), Disp: , Rfl:    brinzolamide (AZOPT) 1 % ophthalmic suspension, 1 drop as directed.   (Patient not taking: Reported on 09/25/2023), Disp: , Rfl:    Calcium-Vitamin D-Vitamin K ([employer] CALCIUM CHEWS) 500-200-40 MG-UNT-MCG  CHEW, Chew by mouth daily.   (Patient not taking: Reported on 09/25/2023), Disp: , Rfl:    chlorpheniramine-HYDROcodone  (TUSSIONEX PENNKINETIC ER) 10-8 MG/5ML LQCR, Take 5 mLs by mouth every 12 (twelve) hours. (Patient not taking: Reported on 09/25/2023), Disp: 115 mL, Rfl: 0   chlorpheniramine-HYDROcodone  (TUSSIONEX) 10-8 MG/5ML LQCR, Take 5 mLs by mouth every 12 (twelve) hours as needed. (Patient not taking: Reported on 09/25/2023), Disp: 140 mL, Rfl: 0   cyclobenzaprine  (FLEXERIL ) 5 MG tablet, Take 1 tablet (5 mg total) by mouth at bedtime as needed for muscle spasms. (Patient not taking: Reported on 09/25/2023), Disp: 12 tablet, Rfl: 0   Diphenhydramine-APAP, sleep, (TYLENOL  PM EXTRA STRENGTH PO), Take by mouth.   (Patient not taking: Reported on 09/25/2023), Disp: , Rfl:    Fluticasone-Umeclidin-Vilant (TRELEGY ELLIPTA ) 200-62.5-25 MCG/ACT AEPB, Inhale 1 puff into the lungs daily., Disp: 3 each, Rfl: 6   Fluticasone-Umeclidin-Vilant (TRELEGY ELLIPTA ) 200-62.5-25 MCG/ACT AEPB, Inhale 1 puff into the lungs daily., Disp: 28 each, Rfl: 0   HYDROcodone -acetaminophen  (NORCO/VICODIN) 5-325 MG tablet, Take 1-2 tablets by mouth every 4 (four) hours as needed. (Patient not taking: Reported on 09/25/2023), Disp: 20 tablet, Rfl: 0   loratadine (CLARITIN) 10 MG tablet, Take 10 mg by mouth daily.   (Patient not taking: Reported on 09/25/2023), Disp: , Rfl:    metoprolol  tartrate (LOPRESSOR ) 25 MG tablet, Take 1 tablet (25 mg total) by mouth 2 (two) times daily., Disp: 60 tablet, Rfl: 0   montelukast (SINGULAIR) 10 MG tablet, Take 10 mg by mouth at bedtime.  ,  Disp: , Rfl:    Multiple Vitamins-Iron (MULTI-VITAMIN/IRON) TABS, Take 1 tablet by mouth 2 (two) times daily. Flintstones chew  (Patient not taking: Reported on 09/25/2023), Disp: , Rfl:    ondansetron  (ZOFRAN ) 4 MG tablet, Take 1 tablet (4 mg total) by mouth every 8 (eight) hours as needed for nausea or vomiting. (Patient not taking: Reported on 09/25/2023), Disp:  12 tablet, Rfl: 0   oxyCODONE  (OXY IR/ROXICODONE ) 5 MG immediate release tablet, Take 1 tablet (5 mg total) by mouth every 4 (four) hours as needed for severe pain. Try not to do more than 4 times a day. (Patient not taking: Reported on 09/25/2023), Disp: 30 tablet, Rfl: 0   oxyCODONE -acetaminophen  (PERCOCET/ROXICET) 5-325 MG tablet, Take 1 tablet by mouth every 6 (six) hours as needed for moderate pain or severe pain. (Patient not taking: Reported on 09/25/2023), Disp: 20 tablet, Rfl: 0   Pseudoeph-Chlorphen-Hydrocod (ZUTRIPRO) 60-4-5 MG/5ML SOLN, Take 5 mLs by mouth 4 (four) times daily as needed. (Patient not taking: Reported on 09/25/2023), Disp: 240 mL, Rfl: 1   traMADol (ULTRAM) 50 MG tablet, Take 50 mg by mouth every 6 (six) hours as needed.   (Patient not taking: Reported on 09/25/2023), Disp: , Rfl:    travoprost, benzalkonium, (TRAVATAN Z) 0.004 % ophthalmic solution, Place 1 drop into both eyes at bedtime.   (Patient not taking: Reported on 09/25/2023), Disp: , Rfl:    varenicline (CHANTIX CONTINUING MONTH PAK) 1 MG tablet, Take 1 mg by mouth as directed.   (Patient not taking: Reported on 09/25/2023), Disp: , Rfl:    vitamin B-12 (CYANOCOBALAMIN) 1000 MCG tablet, Take 1,000 mcg by mouth daily.   (Patient not taking: Reported on 09/25/2023), Disp: , Rfl:    zolpidem  (AMBIEN  CR) 12.5 MG CR tablet, Take 1 tablet (12.5 mg total) by mouth at bedtime as needed for sleep., Disp: 30 tablet, Rfl: 5  Past Medical History: Past Medical History:  Diagnosis Date   Allergy    Asthma    COPD (chronic obstructive pulmonary disease) (HCC)    PFT 07/22/08-FEV1/FVC 0.49   Degenerative disc disease    Edema    Family history of ischemic heart disease    Family history of psychiatric condition    GERD (gastroesophageal reflux disease)    Glaucoma    Obesity, unspecified    Obstructive sleep apnea (adult) (pediatric)    Pneumonia    hosp 2002   Scimitar syndrome    Sleep apnea    NPSG 09/09/07 AHI 8.1/hr  prolong desat   Tobacco use disorder     Tobacco Use: Social History   Tobacco Use  Smoking Status Former   Current packs/day: 0.00   Types: Cigarettes, E-cigarettes   Quit date: 06/25/2010   Years since quitting: 13.4  Smokeless Tobacco Never    Labs: Review Flowsheet  More data may exist      Latest Ref Rng & Units 11/12/2006 03/09/2007 09/07/2007 05/20/2008 08/24/2010  Labs for ITP Cardiac and Pulmonary Rehab  Cholestrol 0 - 200 mg/dL - - - 811  786   LDL (calc) 0 - 99 mg/dL - - - 862  -  Direct LDL mg/dL - - - - 867.2   HDL-C >60.99 mg/dL - - - 65.7  39.39   Trlycerides 0.0 - 149.0 mg/dL - - - 84  04.9   Bicarbonate - 29.2  24.6  - - -  TCO2 - 31  26  29   - -     Pulmonary  Assessment Scores:  Pulmonary Assessment Scores     Row Name 10/28/23 1523         mMRC Score   mMRC Score 3        UCSD: Self-administered rating of dyspnea associated with activities of daily living (ADLs) 6-point scale (0 = not at all to 5 = maximal or unable to do because of breathlessness)  Scoring Scores range from 0 to 120.  Minimally important difference is 5 units  CAT: CAT can identify the health impairment of COPD patients and is better correlated with disease progression.  CAT has a scoring range of zero to 40. The CAT score is classified into four groups of low (less than 10), medium (10 - 20), high (21-30) and very high (31-40) based on the impact level of disease on health status. A CAT score over 10 suggests significant symptoms.  A worsening CAT score could be explained by an exacerbation, poor medication adherence, poor inhaler technique, or progression of COPD or comorbid conditions.  CAT MCID is 2 points  mMRC: mMRC (Modified Medical Research Council) Dyspnea Scale is used to assess the degree of baseline functional disability in patients of respiratory disease due to dyspnea. No minimal important difference is established. A decrease in score of 1 point or greater is  considered a positive change.   Pulmonary Function Assessment:  Pulmonary Function Assessment - 10/27/23 1342       Post Bronchodilator Spirometry Results   FVC% 54 %    FEV1% 33 %    FEV1/FVC Ratio 49      Breath   Shortness of Breath Yes;Limiting activity          Exercise Target Goals: Exercise Program Goal: Individual exercise prescription set using results from initial 6 min walk test and THRR while considering  patient's activity barriers and safety.   Exercise Prescription Goal: Initial exercise prescription builds to 30-45 minutes a day of aerobic activity, 2-3 days per week.  Home exercise guidelines will be given to patient during program as part of exercise prescription that the participant will acknowledge.  Education: Aerobic Exercise: - Group verbal and visual presentation on the components of exercise prescription. Introduces F.I.T.T principle from ACSM for exercise prescriptions.  Reviews F.I.T.T. principles of aerobic exercise including progression. Written material given at graduation.   Education: Resistance Exercise: - Group verbal and visual presentation on the components of exercise prescription. Introduces F.I.T.T principle from ACSM for exercise prescriptions  Reviews F.I.T.T. principles of resistance exercise including progression. Written material given at graduation.    Education: Exercise & Equipment Safety: - Individual verbal instruction and demonstration of equipment use and safety with use of the equipment. Flowsheet Row Pulmonary Rehab from 10/28/2023 in [facility] Cardiac and Pulmonary Rehab  Date 10/28/23  Educator [facility]  Instruction Review Code 1- Verbalizes Understanding    Education: Exercise Physiology & General Exercise Guidelines: - Group verbal and written instruction with models to review the exercise physiology of the cardiovascular system and associated critical values. Provides general exercise guidelines with specific guidelines to those  with heart or lung disease.    Education: Flexibility, Balance, Mind/Body Relaxation: - Group verbal and visual presentation with interactive activity on the components of exercise prescription. Introduces F.I.T.T principle from ACSM for exercise prescriptions. Reviews F.I.T.T. principles of flexibility and balance exercise training including progression. Also discusses the mind body connection.  Reviews various relaxation techniques to help reduce and manage stress (i.e. Deep breathing, progressive muscle relaxation, and visualization). Balance handout  provided to take home. Written material given at graduation.   Activity Barriers & Risk Stratification:  Activity Barriers & Cardiac Risk Stratification - 10/28/23 1517       Activity Barriers & Cardiac Risk Stratification   Activity Barriers Joint Problems;Deconditioning;Muscular Weakness;Shortness of Breath          6 Minute Walk:  6 Minute Walk     Row Name 10/28/23 1514         6 Minute Walk   Phase Initial     Distance 455 feet     Walk Time 6 minutes     # of Rest Breaks 0     MPH 0.86     METS 2.14     RPE 17     Perceived Dyspnea  2     VO2 Peak 7.48     Symptoms Yes (comment)     Comments Left knee pain 7/10     Resting HR 80 bpm     Resting BP 122/82     Resting Oxygen Saturation  96 %     Exercise Oxygen Saturation  during 6 min walk 88 %     Max Ex. HR 125 bpm     Max Ex. BP 156/84     2 Minute Post BP 128/84       Interval HR   1 Minute HR 105     2 Minute HR 106     3 Minute HR 84     4 Minute HR 117     5 Minute HR 125     6 Minute HR 124     2 Minute Post HR 9     Interval Heart Rate? Yes       Interval Oxygen   Interval Oxygen? Yes     Baseline Oxygen Saturation % 96 %     1 Minute Oxygen Saturation % 93 %     1 Minute Liters of Oxygen 0 L     2 Minute Oxygen Saturation % 88 %     2 Minute Liters of Oxygen 0 L     3 Minute Oxygen Saturation % 91 %     3 Minute Liters of Oxygen 0 L     4  Minute Oxygen Saturation % 94 %     4 Minute Liters of Oxygen 0 L     5 Minute Oxygen Saturation % 94 %     5 Minute Liters of Oxygen 0 L     6 Minute Oxygen Saturation % 92 %     6 Minute Liters of Oxygen 0 L     2 Minute Post Oxygen Saturation % 93 %     2 Minute Post Liters of Oxygen 0 L       Oxygen Initial Assessment:  Oxygen Initial Assessment - 10/28/23 1523       Home Oxygen   Home Oxygen Device None    Sleep Oxygen Prescription None    Home Exercise Oxygen Prescription None    Home Resting Oxygen Prescription None      Initial 6 min Walk   Oxygen Used None      Program Oxygen Prescription   Program Oxygen Prescription None      Intervention   Short Term Goals To learn and understand importance of monitoring SPO2 with pulse oximeter and demonstrate accurate use of the pulse oximeter.;To learn and demonstrate proper pursed lip breathing techniques or other breathing techniques. ;To learn  and exhibit compliance with exercise, home and travel O2 prescription;To learn and understand importance of maintaining oxygen saturations>88%;To learn and demonstrate proper use of respiratory medications    Long  Term Goals Exhibits compliance with exercise, home  and travel O2 prescription;Compliance with respiratory medication;Maintenance of O2 saturations>88%;Exhibits proper breathing techniques, such as pursed lip breathing or other method taught during program session;Verbalizes importance of monitoring SPO2 with pulse oximeter and return demonstration;Demonstrates proper use of MDI's          Oxygen Re-Evaluation:   Oxygen Discharge (Final Oxygen Re-Evaluation):   Initial Exercise Prescription:  Initial Exercise Prescription - 10/28/23 1500       Date of Initial Exercise RX and Referring Provider   Date 10/28/23    Referring Provider Dr. Darrin Barn      Oxygen   Maintain Oxygen Saturation 88% or higher      Treadmill   MPH 0.8    Grade 0    Minutes 15     METs 2.14      Recumbant Bike   Level 1    RPM 50    Watts 25    Minutes 15    METs 2.14      NuStep   Level 1    SPM 80    Minutes 15    METs 2.14      REL-XR   Level 1    Watts 25    Speed 50    Minutes 15    METs 2.14      Biostep-RELP   Level 1    SPM 50    Minutes 15    METs 2.14      Track   Laps 15    Minutes 15    METs 2.14      Intensity   THRR 40-80% of Max Heartrate 116-153    Ratings of Perceived Exertion 11-13    Perceived Dyspnea 0-4      Progression   Progression Continue to progress workloads to maintain intensity without signs/symptoms of physical distress.      Resistance Training   Training Prescription Yes    Weight 5lb    Reps 10-15          Perform Capillary Blood Glucose checks as needed.  Exercise Prescription Changes:   Exercise Prescription Changes     Row Name 10/28/23 1500             Response to Exercise   Blood Pressure (Admit) 122/82       Blood Pressure (Exercise) 156/84       Blood Pressure (Exit) 128/84       Heart Rate (Admit) 80 bpm       Heart Rate (Exercise) 125 bpm       Heart Rate (Exit) 90 bpm       Oxygen Saturation (Admit) 96 %       Oxygen Saturation (Exercise) 88 %       Oxygen Saturation (Exit) 93 %       Rating of Perceived Exertion (Exercise) 17       Perceived Dyspnea (Exercise) 3       Symptoms Left knee pain 7/10       Comments results          Exercise Comments:   Exercise Goals and Review:   Exercise Goals     Row Name 10/28/23 1522             Exercise  Goals   Increase Physical Activity Yes       Intervention Provide advice, education, support and counseling about physical activity/exercise needs.;Develop an individualized exercise prescription for aerobic and resistive training based on initial evaluation findings, risk stratification, comorbidities and participant's personal goals.       Expected Outcomes Short Term: Attend rehab on a regular basis to increase  amount of physical activity.;Long Term: Add in home exercise to make exercise part of routine and to increase amount of physical activity.;Long Term: Exercising regularly at least 3-5 days a week.       Increase Strength and Stamina Yes       Intervention Provide advice, education, support and counseling about physical activity/exercise needs.;Develop an individualized exercise prescription for aerobic and resistive training based on initial evaluation findings, risk stratification, comorbidities and participant's personal goals.       Expected Outcomes Short Term: Increase workloads from initial exercise prescription for resistance, speed, and METs.;Short Term: Perform resistance training exercises routinely during rehab and add in resistance training at home;Long Term: Improve cardiorespiratory fitness, muscular endurance and strength as measured by increased METs and functional capacity ( )       Able to understand and use rate of perceived exertion (RPE) scale Yes       Intervention Provide education and explanation on how to use RPE scale       Expected Outcomes Short Term: Able to use RPE daily in rehab to express subjective intensity level;Long Term:  Able to use RPE to guide intensity level when exercising independently       Able to understand and use Dyspnea scale Yes       Intervention Provide education and explanation on how to use Dyspnea scale       Expected Outcomes Short Term: Able to use Dyspnea scale daily in rehab to express subjective sense of shortness of breath during exertion;Long Term: Able to use Dyspnea scale to guide intensity level when exercising independently       Knowledge and understanding of Target Heart Rate Range (THRR) Yes       Intervention Provide education and explanation of THRR including how the numbers were predicted and where they are located for reference       Expected Outcomes Short Term: Able to state/look up THRR;Short Term: Able to use daily as  guideline for intensity in rehab;Long Term: Able to use THRR to govern intensity when exercising independently       Able to check pulse independently Yes       Intervention Provide education and demonstration on how to check pulse in carotid and radial arteries.;Review the importance of being able to check your own pulse for safety during independent exercise       Expected Outcomes Short Term: Able to explain why pulse checking is important during independent exercise;Long Term: Able to check pulse independently and accurately       Understanding of Exercise Prescription Yes       Intervention Provide education, explanation, and written materials on patient's individual exercise prescription       Expected Outcomes Short Term: Able to explain program exercise prescription;Long Term: Able to explain home exercise prescription to exercise independently          Exercise Goals Re-Evaluation :  Exercise Goals Re-Evaluation     Row Name 11/25/23 1146             Exercise Goal Re-Evaluation   Exercise Goals Review Increase Physical Activity;Increase Strength  and Stamina;Understanding of Exercise Prescription       Comments Anna [name] has not attended her first session in the program. She completed her orientation on 6/17.       Expected Outcomes Short: Attend rehab. Long: Continue exercise to improve strength and stamina.          Discharge Exercise Prescription (Final Exercise Prescription Changes):  Exercise Prescription Changes - 10/28/23 1500       Response to Exercise   Blood Pressure (Admit) 122/82    Blood Pressure (Exercise) 156/84    Blood Pressure (Exit) 128/84    Heart Rate (Admit) 80 bpm    Heart Rate (Exercise) 125 bpm    Heart Rate (Exit) 90 bpm    Oxygen Saturation (Admit) 96 %    Oxygen Saturation (Exercise) 88 %    Oxygen Saturation (Exit) 93 %    Rating of Perceived Exertion (Exercise) 17    Perceived Dyspnea (Exercise) 3    Symptoms Left knee pain 7/10     Comments results          Nutrition:  Target Goals: Understanding of nutrition guidelines, daily intake of sodium 1500mg , cholesterol 200mg , calories 30% from fat and 7% or less from saturated fats, daily to have 5 or more servings of fruits and vegetables.  Education: All About Nutrition: -Group instruction provided by verbal, written material, interactive activities, discussions, models, and posters to present general guidelines for heart healthy nutrition including fat, fiber, MyPlate, the role of sodium in heart healthy nutrition, utilization of the nutrition label, and utilization of this knowledge for meal planning. Follow up email sent as well. Written material given at graduation.   Biometrics:  Pre Biometrics - 10/28/23 1522       Pre Biometrics   Height 5' 7.4 (1.712 m)    Weight 284 lb 8 oz (129 kg)    Waist Circumference 56 inches    Hip Circumference 56.5 inches    Waist to Hip Ratio 0.99 %    BMI (Calculated) 44.03    Single Leg Stand 2 seconds           Nutrition Therapy Plan and Nutrition Goals:   Nutrition Assessments:  MEDIFICTS Score Key: >=70 Need to make dietary changes  40-70 Heart Healthy Diet <= 40 Therapeutic Level Cholesterol Diet   Picture Your Plate Scores: <59 Unhealthy dietary pattern with much room for improvement. 41-50 Dietary pattern unlikely to meet recommendations for good health and room for improvement. 51-60 More healthful dietary pattern, with some room for improvement.  >60 Healthy dietary pattern, although there may be some specific behaviors that could be improved.   Nutrition Goals Re-Evaluation:   Nutrition Goals Discharge (Final Nutrition Goals Re-Evaluation):   Psychosocial: Target Goals: Acknowledge presence or absence of significant depression and/or stress, maximize coping skills, provide positive support system. Participant is able to verbalize types and ability to use techniques and skills needed for  reducing stress and depression.   Education: Stress, Anxiety, and Depression - Group verbal and visual presentation to define topics covered.  Reviews how body is impacted by stress, anxiety, and depression.  Also discusses healthy ways to reduce stress and to treat/manage anxiety and depression.  Written material given at graduation.   Education: Sleep Hygiene -Provides group verbal and written instruction about how sleep can affect your health.  Define sleep hygiene, discuss sleep cycles and impact of sleep habits. Review good sleep hygiene tips.    Initial Review & Psychosocial Screening:  Initial Psych Review & Screening - 10/27/23 1350       Initial Review   Current issues with History of Depression      Family Dynamics   Good Support System? Yes    Comments She can look to her wife and sister. She lost her mother 2 years ago  and her father six months after. Medication did not help and at times made her feel worse and does not take anything for her mood.      Barriers   Psychosocial barriers to participate in program The patient should benefit from training in stress management and relaxation.      Screening Interventions   Interventions Encouraged to exercise;To provide support and resources with identified psychosocial needs;Provide feedback about the scores to participant    Expected Outcomes Short Term goal: Utilizing psychosocial counselor, staff and physician to assist with identification of specific Stressors or current issues interfering with healing process. Setting desired goal for each stressor or current issue identified.;Long Term Goal: Stressors or current issues are controlled or eliminated.;Short Term goal: Identification and review with participant of any Quality of Life or Depression concerns found by scoring the questionnaire.;Long Term goal: The participant improves quality of Life and PHQ9 Scores as seen by post scores and/or verbalization of changes           Quality of Life Scores:  Scores of 19 and below usually indicate a poorer quality of life in these areas.  A difference of  2-3 points is a clinically meaningful difference.  A difference of 2-3 points in the total score of the Quality of Life Index has been associated with significant improvement in overall quality of life, self-image, physical symptoms, and general health in studies assessing change in quality of life.  PHQ-9: Review Flowsheet       10/28/2023  Depression screen PHQ 2/9  Decreased Interest 1  Down, Depressed, Hopeless 1  PHQ - 2 Score 2  Altered sleeping 3  Tired, decreased energy 2  Change in appetite 1  Feeling bad or failure about yourself  0  Trouble concentrating 0  Moving slowly or fidgety/restless 0  Suicidal thoughts 0  PHQ-9 Score 8  Difficult doing work/chores Very difficult   Interpretation of Total Score  Total Score Depression Severity:  1-4 = Minimal depression, 5-9 = Mild depression, 10-14 = Moderate depression, 15-19 = Moderately severe depression, 20-27 = Severe depression   Psychosocial Evaluation and Intervention:  Psychosocial Evaluation - 10/27/23 1357       Psychosocial Evaluation & Interventions   Interventions Encouraged to exercise with the program and follow exercise prescription;Relaxation education;Stress management education    Comments She can look to her wife and sister. She lost her mother 2 years ago and her father six months after. Medication did not help and at times made her feel worse and does not take anything for her mood.    Expected Outcomes Short: Start [facility] to help with mood. Long: Maintain a healthy mental state    Continue Psychosocial Services  Follow up required by staff          Psychosocial Re-Evaluation:   Psychosocial Discharge (Final Psychosocial Re-Evaluation):   Education: Education Goals: Education classes will be provided on a weekly basis, covering required topics. Participant will  state understanding/return demonstration of topics presented.  Learning Barriers/Preferences:  Learning Barriers/Preferences - 10/27/23 1344       Learning Barriers/Preferences   Learning Barriers None    Learning Preferences None  General Pulmonary Education Topics:  Infection Prevention: - Provides verbal and written material to individual with discussion of infection control including proper hand washing and proper equipment cleaning during exercise session. Flowsheet Row Pulmonary Rehab from 10/28/2023 in [facility] Cardiac and Pulmonary Rehab  Date 10/28/23  Educator [facility]  Instruction Review Code 1- Verbalizes Understanding    Falls Prevention: - Provides verbal and written material to individual with discussion of falls prevention and safety. Flowsheet Row Pulmonary Rehab from 10/28/2023 in [facility] Cardiac and Pulmonary Rehab  Date 10/28/23  Educator [facility]  Instruction Review Code 1- Verbalizes Understanding    Chronic Lung Disease Review: - Group verbal instruction with posters, models, PowerPoint presentations and videos,  to review new updates, new respiratory medications, new advancements in procedures and treatments. Providing information on websites and 800 numbers for continued self-education. Includes information about supplement oxygen, available portable oxygen systems, continuous and intermittent flow rates, oxygen safety, concentrators, and Medicare reimbursement for oxygen. Explanation of Pulmonary Drugs, including class, frequency, complications, importance of spacers, rinsing mouth after steroid MDI's, and proper cleaning methods for nebulizers. Review of basic lung anatomy and physiology related to function, structure, and complications of lung disease. Review of risk factors. Discussion about methods for diagnosing sleep apnea and types of masks and machines for OSA. Includes a review of the use of types of environmental controls: home humidity, furnaces, filters,  dust mite/pet prevention, HEPA vacuums. Discussion about weather changes, air quality and the benefits of nasal washing. Instruction on Warning signs, infection symptoms, calling MD promptly, preventive modes, and value of vaccinations. Review of effective airway clearance, coughing and/or vibration techniques. Emphasizing that all should Create an Action Plan. Written material given at graduation.   AED/CPR: - Group verbal and written instruction with the use of models to demonstrate the basic use of the AED with the basic ABC's of resuscitation.    Anatomy and Cardiac Procedures: - Group verbal and visual presentation and models provide information about basic cardiac anatomy and function. Reviews the testing methods done to diagnose heart disease and the outcomes of the test results. Describes the treatment choices: Medical Management, Angioplasty, or Coronary Bypass Surgery for treating various heart conditions including Myocardial Infarction, Angina, Valve Disease, and Cardiac Arrhythmias.  Written material given at graduation.   Medication Safety: - Group verbal and visual instruction to review commonly prescribed medications for heart and lung disease. Reviews the medication, class of the drug, and side effects. Includes the steps to properly store meds and maintain the prescription regimen.  Written material given at graduation.   Other: -Provides group and verbal instruction on various topics (see comments)   Knowledge Questionnaire Score:    Core Components/Risk Factors/Patient Goals at Admission:  Personal Goals and Risk Factors at Admission - 10/27/23 1347       Core Components/Risk Factors/Patient Goals on Admission    Weight Management Yes;Weight Loss    Intervention Weight Management: Develop a combined nutrition and exercise program designed to reach desired caloric intake, while maintaining appropriate intake of nutrient and fiber, sodium and fats, and appropriate energy  expenditure required for the weight goal.;Weight Management: Provide education and appropriate resources to help participant work on and attain dietary goals.;Weight Management/Obesity: Establish reasonable short term and long term weight goals.;Obesity: Provide education and appropriate resources to help participant work on and attain dietary goals.    Expected Outcomes Short Term: Continue to assess and modify interventions until short term weight is achieved;Weight Loss: Understanding of general recommendations for a  balanced deficit meal plan, which promotes 1-2 lb weight loss per week and includes a negative energy balance of [phone] kcal/d;Understanding of distribution of calorie intake throughout the day with the consumption of 4-5 meals/snacks;Understanding recommendations for meals to include 15-35% energy as protein, 25-35% energy from fat, 35-60% energy from carbohydrates, less than 200mg  of dietary cholesterol, 20-35 gm of total fiber daily    Improve shortness of breath with ADL's Yes    Intervention Provide education, individualized exercise plan and daily activity instruction to help decrease symptoms of SOB with activities of daily living.    Expected Outcomes Short Term: Improve cardiorespiratory fitness to achieve a reduction of symptoms when performing ADLs;Long Term: Be able to perform more ADLs without symptoms or delay the onset of symptoms    Hypertension Yes    Intervention Provide education on lifestyle modifcations including regular physical activity/exercise, weight management, moderate sodium restriction and increased consumption of fresh fruit, vegetables, and low fat dairy, alcohol moderation, and smoking cessation.;Monitor prescription use compliance.    Expected Outcomes Short Term: Continued assessment and intervention until BP is < 140/55mm HG in hypertensive participants. < 130/83mm HG in hypertensive participants with diabetes, heart failure or chronic kidney disease.;Long  Term: Maintenance of blood pressure at goal levels.          Education:Diabetes - Individual verbal and written instruction to review signs/symptoms of diabetes, desired ranges of glucose level fasting, after meals and with exercise. Acknowledge that pre and post exercise glucose checks will be done for 3 sessions at entry of program.   Know Your Numbers and Heart Failure: - Group verbal and visual instruction to discuss disease risk factors for cardiac and pulmonary disease and treatment options.  Reviews associated critical values for Overweight/Obesity, Hypertension, Cholesterol, and Diabetes.  Discusses basics of heart failure: signs/symptoms and treatments.  Introduces Heart Failure Zone chart for action plan for heart failure.  Written material given at graduation.   Core Components/Risk Factors/Patient Goals Review:    Core Components/Risk Factors/Patient Goals at Discharge (Final Review):    ITP Comments:  ITP Comments     Row Name 10/27/23 1400 10/28/23 1514 11/12/23 1104 12/03/23 1626     ITP Comments Virtual Visit completed. Patient informed on EP and RD appointment and 6 Minute walk test. Patient also informed of patient health questionnaires on My Chart. Patient Verbalizes understanding. Visit diagnosis can be found in [facility] 09/25/[facility]23. Completed and gym orientation for pulmonary rehab. Initial ITP created and sent for review to Dr. Faud Aleskerov, Medical Director. 30 Day review completed. Medical Director ITP review done, changes made as directed, and signed approval by Medical Director. Discharge Letter sent due to lack of attendance and unsuccessful communication attempts. She has not attended since her orientation on 6/17. She will be discharged at this time.       Comments: Early Discharge ITP

## 2023-12-04 ENCOUNTER — Ambulatory Visit

## 2023-12-08 ENCOUNTER — Ambulatory Visit

## 2023-12-11 ENCOUNTER — Ambulatory Visit

## 2023-12-12 ENCOUNTER — Other Ambulatory Visit: Payer: Self-pay | Admitting: Pulmonary Disease

## 2023-12-12 DIAGNOSIS — J4489 Other specified chronic obstructive pulmonary disease: Secondary | ICD-10-CM

## 2023-12-15 ENCOUNTER — Ambulatory Visit

## 2023-12-18 ENCOUNTER — Ambulatory Visit

## 2023-12-22 ENCOUNTER — Ambulatory Visit

## 2023-12-25 ENCOUNTER — Ambulatory Visit

## 2023-12-29 ENCOUNTER — Ambulatory Visit

## 2024-01-01 ENCOUNTER — Ambulatory Visit

## 2024-01-05 ENCOUNTER — Ambulatory Visit

## 2024-01-08 ENCOUNTER — Ambulatory Visit

## 2024-01-15 ENCOUNTER — Ambulatory Visit

## 2024-01-19 ENCOUNTER — Ambulatory Visit

## 2024-01-22 ENCOUNTER — Ambulatory Visit

## 2024-01-26 ENCOUNTER — Ambulatory Visit

## 2024-01-29 ENCOUNTER — Ambulatory Visit

## 2024-02-02 ENCOUNTER — Ambulatory Visit

## 2024-02-02 ENCOUNTER — Other Ambulatory Visit: Payer: Self-pay | Admitting: Pulmonary Disease

## 2024-02-02 DIAGNOSIS — R0609 Other forms of dyspnea: Secondary | ICD-10-CM

## 2024-02-05 ENCOUNTER — Ambulatory Visit

## 2024-02-09 ENCOUNTER — Ambulatory Visit

## 2024-02-12 ENCOUNTER — Ambulatory Visit: Admitting: Pulmonary Disease

## 2024-02-12 ENCOUNTER — Ambulatory Visit

## 2024-02-16 ENCOUNTER — Ambulatory Visit

## 2024-02-19 ENCOUNTER — Ambulatory Visit

## 2024-02-23 ENCOUNTER — Ambulatory Visit

## 2024-02-26 ENCOUNTER — Ambulatory Visit

## 2024-03-01 ENCOUNTER — Ambulatory Visit

## 2024-04-26 ENCOUNTER — Ambulatory Visit: Payer: Self-pay | Admitting: Pulmonary Disease

## 2024-04-26 NOTE — Telephone Encounter (Signed)
 FYI Only or Action Required?: Action required by provider: requesting prednosone.  Patient is followed in Pulmonology for COPD, last seen on 09/25/2023 by Malka Domino, MD.  Called Nurse Triage reporting Shortness of Breath (COPD, cough).  Symptoms began [name] ago.  Interventions attempted: Rescue inhaler, Maintenance inhaler, and Nebulizer treatments.  Symptoms are: gradually worsening.  Triage Disposition: See HCP Within 4 Hours (Or PCP Triage)  Patient/caregiver understands and will follow disposition?: declines offer of appointment due to transportation- unable to come to office until Wednesday. Patient is requesting Prednisone   E2C2 Pulmonary Triage - Initial Assessment Questions Chief Complaint (e.g., cough, sob, wheezing, fever, chills, sweat or additional symptoms) *Go to specific symptom protocol after initial questions. URI- cough, SOB  How long have symptoms been present? Th/Fr am  Have you tested for COVID or Flu? Note: If not, ask patient if [name] home test can be taken. If so, instruct patient to call back for positive results. Yes- OTC- negative  MEDICINES:   Have you used any OTC meds to help with symptoms? Yes If yes, ask What medications? Mucinex, ibuprofen  Have you used your inhalers/maintenance medication? Yes If yes, What medications? Triligy, rescue inhaler, nebulizer  If inhaler, ask How many puffs and how often? Note: Review instructions on medication in the chart. Last couple days using rescue inhaler more often  OXYGEN: Do you wear supplemental oxygen? No If yes, How many liters are you supposed to use? na  Do you monitor your oxygen levels? No If yes, What is your reading (oxygen level) today? na  What is your usual oxygen saturation reading?  (Note: Pulmonary O2 sats should be 90% or greater) naFYI Only or Action Required?:         Copied from CRM #[id number]. Topic: Clinical - Red Word Triage >> Apr 26, 2024  8:24 AM Anna [name] wrote: Red Word that prompted transfer to Nurse Triage: pt has developed an upper respiratory infection, is having more SOB, productive cough w/ greenish phlegm, more wheezing - pt has copd, would like [name] of prednisone  Reason for Disposition  [1] Longstanding difficulty breathing (e.g., CHF, COPD, emphysema) AND [2] WORSE than normal  Answer Assessment - Initial Assessment Questions Patient was offered appointment today- but declines- has no transportation. Patient is requesting prednisone  for symptoms- feels this will help- does not want antibiotic- feels his symptoms are viral. Patient advised of office policy regarding Rx for URI- he states he will do VV if necessary  1. RESPIRATORY STATUS: Describe your breathing? (e.g., wheezing, shortness of breath, unable to speak, severe coughing)      Cough, SOB 2. ONSET: When did this breathing problem begin?      Started last week 3. PATTERN Does the difficult breathing come and go, or has it been constant since it started?      Comes and goes 4. SEVERITY: How bad is your breathing? (e.g., mild, moderate, severe)      SOB- with exertion 5. RECURRENT SYMPTOM: Have you had difficulty breathing before? If Yes, ask: When was the last time? and What happened that time?      Yes- patient has COPD- exacerbated by cough 6. CARDIAC HISTORY: Do you have any history of heart disease? (e.g., heart attack, angina, bypass surgery, angioplasty)      no 7. LUNG HISTORY: Do you have any history of lung disease?  (e.g., pulmonary embolus, asthma, emphysema)     COPD chest/side pain- coughing, asthma 8. CAUSE: What do you think  is causing the breathing problem?      URI 9. OTHER SYMPTOMS: Do you have any other symptoms? (e.g., chest pain, cough, dizziness, fever, runny nose)     Cough, increased SOB, feverish- other night,  10. O2 SATURATION MONITOR:  Do you use an oxygen saturation monitor (pulse oximeter) at  home? If Yes, ask: What is your reading (oxygen level) today? What is your usual oxygen saturation reading? (e.g., 95%)       Does not measure  12. TRAVEL: Have you traveled out of the country in the last month? (e.g., travel history, exposures)       Partner has recently been sick  Protocols used: Breathing Difficulty-[name]-AH

## 2024-04-28 ENCOUNTER — Other Ambulatory Visit: Payer: Self-pay

## 2024-04-28 ENCOUNTER — Ambulatory Visit: Payer: Self-pay

## 2024-04-28 DIAGNOSIS — J441 Chronic obstructive pulmonary disease with (acute) exacerbation: Secondary | ICD-10-CM

## 2024-04-28 DIAGNOSIS — J4489 Other specified chronic obstructive pulmonary disease: Secondary | ICD-10-CM

## 2024-04-28 DIAGNOSIS — R0609 Other forms of dyspnea: Secondary | ICD-10-CM

## 2024-04-28 MED ORDER — PREDNISONE 20 MG PO TABS: 20.0000 mg | ORAL_TABLET | Freq: Every day | ORAL | 0 refills | Status: AC

## 2024-04-28 MED ORDER — AZITHROMYCIN 250 MG PO TABS: ORAL_TABLET | ORAL | 0 refills | Status: AC

## 2024-04-28 NOTE — Telephone Encounter (Signed)
 FYI Only or Action Required?: FYI only for provider: gave instructions for medications. .  Patient is followed in Pulmonology for copd, asthma, last seen on 09/25/2023 by Anna Domino, MD.  Called Anna [name] reporting Advice Only.  Symptoms began see 12.15 [name] note.  Interventions attempted: OTC medications: mucinex and Other: whiskey, honey lemon.  Symptoms are: gradually improving.  [name] Disposition: Information or Advice Only Call  Patient/caregiver understands and will follow disposition?: Yes   Copied from CRM [id number]. Topic: Clinical - Red Word [name] >> Apr 28, 2024  4:24 PM Anna [name] wrote: Red Word that prompted transfer to Anna [name]: Patient returning call from Dr. Malka, transferred to NT so that they can relay his advice to go to ED if symptoms worsen. Reason for Disposition  [1] Follow-up call to recent contact AND [2] information only call, no [name] required  Answer Assessment - Initial Assessment Questions Pt was returning a call. Pt was triaged on Monday. Pt is very upset that she called first thing Monday morning and here it is end of business on Wednesday and it's just being addressed. Rn did apologize for the delay. She states the second frustration is that someone called her from the office and no one left a VM. So she had to google the number to see who it was and then call back. Pt states she has been taking over the counter mucinex and homemade cough medicine with whiskey lemon and honey. She states she is doing slightly better but she gets this often and knows what is it.  Rn read the information and instructions for the medications, called in. Pt stated understanding.     1. REASON FOR CALL: What is the main reason for your call? or How can I best help you?     Returning a call 2. SYMPTOMS : Do you have any symptoms?      Symptoms slightly improving since triaged on Monday.  3. OTHER QUESTIONS: Do you have any other questions?      Denies.  Protocols used: Information Only Call - No [name]-A-AH

## 2024-04-29 NOTE — Telephone Encounter (Addendum)
 See telephone encounter from 12/17. The patient is aware.  Nothing further needed.

## 2024-04-29 NOTE — Telephone Encounter (Signed)
 Noted. Nothing further needed.
